# Patient Record
Sex: Female | Born: 1970 | Race: White | Hispanic: No | Marital: Single | Smoking: Former smoker
Health system: Southern US, Community
[De-identification: ages and names within clinical notes are randomized; demographics above are authoritative.]

## PROBLEM LIST (undated history)

## (undated) DIAGNOSIS — F32A Depression, unspecified: Secondary | ICD-10-CM

## (undated) DIAGNOSIS — I82409 Acute embolism and thrombosis of unspecified deep veins of unspecified lower extremity: Secondary | ICD-10-CM

## (undated) DIAGNOSIS — I2699 Other pulmonary embolism without acute cor pulmonale: Secondary | ICD-10-CM

## (undated) DIAGNOSIS — F329 Major depressive disorder, single episode, unspecified: Secondary | ICD-10-CM

## (undated) DIAGNOSIS — E079 Disorder of thyroid, unspecified: Secondary | ICD-10-CM

## (undated) DIAGNOSIS — Z87891 Personal history of nicotine dependence: Secondary | ICD-10-CM

## (undated) HISTORY — DX: Other pulmonary embolism without acute cor pulmonale: I26.99

## (undated) HISTORY — DX: Major depressive disorder, single episode, unspecified: F32.9

## (undated) HISTORY — DX: Disorder of thyroid, unspecified: E07.9

## (undated) HISTORY — DX: Acute embolism and thrombosis of unspecified deep veins of unspecified lower extremity: I82.409

## (undated) HISTORY — DX: Depression, unspecified: F32.A

## (undated) HISTORY — DX: Personal history of nicotine dependence: Z87.891

---

## 1999-05-28 ENCOUNTER — Other Ambulatory Visit: Admission: RE | Admit: 1999-05-28 | Discharge: 1999-05-28 | Payer: Self-pay | Admitting: Obstetrics and Gynecology

## 1999-05-31 ENCOUNTER — Ambulatory Visit (HOSPITAL_COMMUNITY): Admission: RE | Admit: 1999-05-31 | Discharge: 1999-05-31 | Payer: Self-pay | Admitting: Obstetrics and Gynecology

## 1999-06-18 ENCOUNTER — Ambulatory Visit (HOSPITAL_COMMUNITY): Admission: RE | Admit: 1999-06-18 | Discharge: 1999-06-18 | Payer: Self-pay | Admitting: Obstetrics and Gynecology

## 1999-07-09 ENCOUNTER — Ambulatory Visit (HOSPITAL_COMMUNITY): Admission: RE | Admit: 1999-07-09 | Discharge: 1999-07-09 | Payer: Self-pay | Admitting: Obstetrics and Gynecology

## 1999-07-23 ENCOUNTER — Ambulatory Visit (HOSPITAL_COMMUNITY): Admission: RE | Admit: 1999-07-23 | Discharge: 1999-07-23 | Payer: Self-pay | Admitting: Obstetrics and Gynecology

## 1999-08-13 ENCOUNTER — Encounter (HOSPITAL_COMMUNITY): Admission: RE | Admit: 1999-08-13 | Discharge: 1999-09-03 | Payer: Self-pay | Admitting: Obstetrics and Gynecology

## 1999-09-02 ENCOUNTER — Inpatient Hospital Stay (HOSPITAL_COMMUNITY): Admission: AD | Admit: 1999-09-02 | Discharge: 1999-09-04 | Payer: Self-pay | Admitting: Obstetrics and Gynecology

## 2001-08-22 ENCOUNTER — Other Ambulatory Visit: Admission: RE | Admit: 2001-08-22 | Discharge: 2001-08-22 | Payer: Self-pay | Admitting: Obstetrics and Gynecology

## 2003-06-23 ENCOUNTER — Emergency Department (HOSPITAL_COMMUNITY): Admission: AD | Admit: 2003-06-23 | Discharge: 2003-06-23 | Payer: Self-pay | Admitting: Family Medicine

## 2003-06-25 ENCOUNTER — Emergency Department (HOSPITAL_COMMUNITY): Admission: EM | Admit: 2003-06-25 | Discharge: 2003-06-25 | Payer: Self-pay | Admitting: Family Medicine

## 2003-09-08 ENCOUNTER — Encounter: Admission: RE | Admit: 2003-09-08 | Discharge: 2003-09-08 | Payer: Self-pay | Admitting: Family Medicine

## 2003-09-17 ENCOUNTER — Encounter: Admission: RE | Admit: 2003-09-17 | Discharge: 2003-09-17 | Payer: Self-pay | Admitting: Family Medicine

## 2003-09-22 ENCOUNTER — Encounter: Admission: RE | Admit: 2003-09-22 | Discharge: 2003-09-22 | Payer: Self-pay | Admitting: Family Medicine

## 2004-01-09 ENCOUNTER — Encounter: Admission: RE | Admit: 2004-01-09 | Discharge: 2004-01-09 | Payer: Self-pay | Admitting: Family Medicine

## 2004-01-29 ENCOUNTER — Ambulatory Visit: Payer: Self-pay | Admitting: Family Medicine

## 2004-08-16 ENCOUNTER — Ambulatory Visit: Payer: Self-pay | Admitting: Family Medicine

## 2004-11-05 ENCOUNTER — Ambulatory Visit: Payer: Self-pay | Admitting: Family Medicine

## 2005-01-14 ENCOUNTER — Ambulatory Visit: Payer: Self-pay | Admitting: Family Medicine

## 2005-07-27 ENCOUNTER — Encounter (INDEPENDENT_AMBULATORY_CARE_PROVIDER_SITE_OTHER): Payer: Self-pay | Admitting: *Deleted

## 2005-07-27 LAB — CONVERTED CEMR LAB

## 2005-08-12 ENCOUNTER — Ambulatory Visit: Payer: Self-pay | Admitting: Family Medicine

## 2005-08-29 ENCOUNTER — Ambulatory Visit: Payer: Self-pay | Admitting: Internal Medicine

## 2006-07-20 DIAGNOSIS — F329 Major depressive disorder, single episode, unspecified: Secondary | ICD-10-CM

## 2006-07-20 DIAGNOSIS — F17200 Nicotine dependence, unspecified, uncomplicated: Secondary | ICD-10-CM | POA: Insufficient documentation

## 2006-07-20 DIAGNOSIS — F3289 Other specified depressive episodes: Secondary | ICD-10-CM | POA: Insufficient documentation

## 2006-07-20 DIAGNOSIS — E669 Obesity, unspecified: Secondary | ICD-10-CM

## 2006-07-20 DIAGNOSIS — E039 Hypothyroidism, unspecified: Secondary | ICD-10-CM

## 2006-07-21 ENCOUNTER — Encounter (INDEPENDENT_AMBULATORY_CARE_PROVIDER_SITE_OTHER): Payer: Self-pay | Admitting: *Deleted

## 2006-11-27 ENCOUNTER — Telehealth: Payer: Self-pay | Admitting: *Deleted

## 2007-05-04 ENCOUNTER — Encounter (INDEPENDENT_AMBULATORY_CARE_PROVIDER_SITE_OTHER): Payer: Self-pay | Admitting: Family Medicine

## 2007-05-04 ENCOUNTER — Ambulatory Visit: Payer: Self-pay | Admitting: Family Medicine

## 2007-05-07 LAB — CONVERTED CEMR LAB
BUN: 9 mg/dL (ref 6–23)
CO2: 22 meq/L (ref 19–32)
Chloride: 106 meq/L (ref 96–112)
Glucose, Bld: 59 mg/dL — ABNORMAL LOW (ref 70–99)
Hemoglobin: 13.3 g/dL (ref 12.0–15.0)
MCHC: 31.7 g/dL (ref 30.0–36.0)
TSH: 6.996 microintl units/mL — ABNORMAL HIGH (ref 0.350–5.50)

## 2007-05-09 ENCOUNTER — Telehealth (INDEPENDENT_AMBULATORY_CARE_PROVIDER_SITE_OTHER): Payer: Self-pay | Admitting: Family Medicine

## 2007-06-18 ENCOUNTER — Encounter: Payer: Self-pay | Admitting: *Deleted

## 2007-08-10 ENCOUNTER — Encounter (INDEPENDENT_AMBULATORY_CARE_PROVIDER_SITE_OTHER): Payer: Self-pay | Admitting: Family Medicine

## 2007-08-10 ENCOUNTER — Ambulatory Visit: Payer: Self-pay | Admitting: Family Medicine

## 2007-08-15 ENCOUNTER — Telehealth (INDEPENDENT_AMBULATORY_CARE_PROVIDER_SITE_OTHER): Payer: Self-pay | Admitting: Family Medicine

## 2007-10-18 ENCOUNTER — Telehealth: Payer: Self-pay | Admitting: *Deleted

## 2007-11-21 ENCOUNTER — Encounter (INDEPENDENT_AMBULATORY_CARE_PROVIDER_SITE_OTHER): Payer: Self-pay | Admitting: Family Medicine

## 2007-11-21 ENCOUNTER — Ambulatory Visit: Payer: Self-pay | Admitting: Family Medicine

## 2007-11-29 ENCOUNTER — Emergency Department (HOSPITAL_COMMUNITY): Admission: EM | Admit: 2007-11-29 | Discharge: 2007-11-29 | Payer: Self-pay | Admitting: Family Medicine

## 2007-11-29 LAB — CONVERTED CEMR LAB
Cholesterol: 208 mg/dL — ABNORMAL HIGH (ref 0–200)
LDL Cholesterol: 110 mg/dL — ABNORMAL HIGH (ref 0–99)
Total CHOL/HDL Ratio: 4.6
Triglycerides: 263 mg/dL — ABNORMAL HIGH (ref <150)

## 2007-12-07 ENCOUNTER — Encounter (INDEPENDENT_AMBULATORY_CARE_PROVIDER_SITE_OTHER): Payer: Self-pay | Admitting: Family Medicine

## 2007-12-07 ENCOUNTER — Encounter (INDEPENDENT_AMBULATORY_CARE_PROVIDER_SITE_OTHER): Payer: Self-pay | Admitting: *Deleted

## 2008-01-02 ENCOUNTER — Ambulatory Visit: Payer: Self-pay | Admitting: Family Medicine

## 2008-01-02 ENCOUNTER — Encounter (INDEPENDENT_AMBULATORY_CARE_PROVIDER_SITE_OTHER): Payer: Self-pay | Admitting: Family Medicine

## 2008-01-04 ENCOUNTER — Telehealth: Payer: Self-pay | Admitting: *Deleted

## 2008-01-04 LAB — CONVERTED CEMR LAB: TSH: 6.109 microintl units/mL — ABNORMAL HIGH (ref 0.350–4.50)

## 2008-02-04 ENCOUNTER — Ambulatory Visit: Payer: Self-pay | Admitting: Family Medicine

## 2008-04-29 ENCOUNTER — Ambulatory Visit: Payer: Self-pay | Admitting: Family Medicine

## 2008-04-29 ENCOUNTER — Encounter (INDEPENDENT_AMBULATORY_CARE_PROVIDER_SITE_OTHER): Payer: Self-pay | Admitting: Family Medicine

## 2008-04-29 LAB — CONVERTED CEMR LAB
Blood in Urine, dipstick: NEGATIVE
Ketones, urine, test strip: NEGATIVE
WBC Urine, dipstick: NEGATIVE
pH: 7

## 2008-05-12 ENCOUNTER — Telehealth: Payer: Self-pay | Admitting: *Deleted

## 2008-06-24 ENCOUNTER — Encounter (INDEPENDENT_AMBULATORY_CARE_PROVIDER_SITE_OTHER): Payer: Self-pay | Admitting: Family Medicine

## 2008-06-24 ENCOUNTER — Ambulatory Visit: Payer: Self-pay | Admitting: Family Medicine

## 2008-06-25 LAB — CONVERTED CEMR LAB
Free T4: 1.12 ng/dL (ref 0.89–1.80)
T3, Free: 2.7 pg/mL (ref 2.3–4.2)

## 2008-09-29 ENCOUNTER — Ambulatory Visit: Payer: Self-pay | Admitting: Family Medicine

## 2008-09-29 ENCOUNTER — Encounter (INDEPENDENT_AMBULATORY_CARE_PROVIDER_SITE_OTHER): Payer: Self-pay | Admitting: Family Medicine

## 2008-09-30 ENCOUNTER — Encounter (INDEPENDENT_AMBULATORY_CARE_PROVIDER_SITE_OTHER): Payer: Self-pay | Admitting: Family Medicine

## 2008-09-30 LAB — CONVERTED CEMR LAB: TSH: 5.01 microintl units/mL — ABNORMAL HIGH (ref 0.350–4.500)

## 2008-10-02 LAB — CONVERTED CEMR LAB: T3, Free: 2.5 pg/mL (ref 2.3–4.2)

## 2008-11-05 ENCOUNTER — Ambulatory Visit: Payer: Self-pay | Admitting: Family Medicine

## 2008-12-17 ENCOUNTER — Telehealth: Payer: Self-pay | Admitting: *Deleted

## 2008-12-24 ENCOUNTER — Telehealth: Payer: Self-pay | Admitting: Family Medicine

## 2009-01-09 ENCOUNTER — Encounter: Payer: Self-pay | Admitting: Family Medicine

## 2009-01-09 ENCOUNTER — Ambulatory Visit: Payer: Self-pay | Admitting: Family Medicine

## 2009-01-20 ENCOUNTER — Ambulatory Visit: Payer: Self-pay | Admitting: Family Medicine

## 2009-03-11 ENCOUNTER — Ambulatory Visit: Payer: Self-pay | Admitting: Family Medicine

## 2009-03-11 ENCOUNTER — Encounter: Payer: Self-pay | Admitting: Family Medicine

## 2009-03-12 ENCOUNTER — Encounter: Payer: Self-pay | Admitting: Family Medicine

## 2009-07-22 ENCOUNTER — Telehealth (INDEPENDENT_AMBULATORY_CARE_PROVIDER_SITE_OTHER): Payer: Self-pay | Admitting: Family Medicine

## 2009-10-11 ENCOUNTER — Emergency Department (HOSPITAL_COMMUNITY): Admission: EM | Admit: 2009-10-11 | Discharge: 2009-10-11 | Payer: Self-pay | Admitting: Family Medicine

## 2010-03-03 ENCOUNTER — Ambulatory Visit: Payer: Self-pay | Admitting: Family Medicine

## 2010-04-09 ENCOUNTER — Other Ambulatory Visit: Admission: RE | Admit: 2010-04-09 | Discharge: 2010-04-09 | Payer: Self-pay | Admitting: Family Medicine

## 2010-04-09 ENCOUNTER — Ambulatory Visit: Payer: Self-pay | Admitting: Family Medicine

## 2010-04-12 ENCOUNTER — Ambulatory Visit: Payer: Self-pay | Admitting: Family Medicine

## 2010-04-12 ENCOUNTER — Encounter: Payer: Self-pay | Admitting: Family Medicine

## 2010-04-12 LAB — CONVERTED CEMR LAB
BUN: 7 mg/dL (ref 6–23)
CO2: 27 meq/L (ref 19–32)
Calcium: 9.3 mg/dL (ref 8.4–10.5)
Creatinine, Ser: 0.74 mg/dL (ref 0.40–1.20)
HDL: 57 mg/dL (ref >39)
LDL Cholesterol: 105 mg/dL — ABNORMAL HIGH (ref 0–99)
Total CHOL/HDL Ratio: 3.4
Triglycerides: 166 mg/dL — ABNORMAL HIGH (ref <150)
VLDL: 33 mg/dL (ref 0–40)

## 2010-04-14 ENCOUNTER — Encounter: Payer: Self-pay | Admitting: Family Medicine

## 2010-05-05 ENCOUNTER — Telehealth: Payer: Self-pay | Admitting: Family Medicine

## 2010-06-24 NOTE — Assessment & Plan Note (Signed)
Summary: cpe,df   Vital Signs:  Patient profile:   40 year old female Height:      69 inches Weight:      222 pounds BMI:     32.90 Temp:     97.9 degrees F oral Pulse rate:   74 / minute BP sitting:   120 / 78  (left arm) Cuff size:   large  Vitals Entered By: Tessie Fass CMA (April 09, 2010 8:59 AM) CC: complete physical with pap Is Patient Diabetic? No Pain Assessment Patient in pain? no        Primary Care Provider:  Delbert Harness MD  CC:  complete physical with pap.  History of Present Illness: 40 yo here for annual gyn  LMP:  end of october Contraception: abstinence Regular Menses: yes Hx of Anemia: no FHx of Breast, Uterine, Cervical or Ovarian Cancer:  No Last Pap: 2009, last few paps normal, colposcopy in the past Hx of Abnormal Pap:  No Desires STD testing: No Last Mammogram: none Abnormalities on Self-exam:  No  hypothyroidism:  continues to take synthroid.  no constipation, palpiatios, weight changed  Depression:  feels wel  tobacco use:  smoking about a pack per day but is working on [employer] nicotine patches.  is usuing 21 mg patch feels like her blood pressure is higher with this but does not have any readings.  Obesity: has lost about 10 pounds since last visit.  no regular exercise or diet      Habits & Providers  Alcohol-Tobacco-Diet     Tobacco Status: quit  Current Medications (verified): 1)  Synthroid 150 Mcg Tabs (Levothyroxine Sodium) .... Take One Tablet Daily As Directed 2)  Triamcinolone Acetonide 0.5 % Oint (Triamcinolone Acetonide) .... Apply To Left Shin Two Times A Day - Cover The Shin At Night To Prevent Scratching - Disp 60 Grams  Allergies: No Known Drug Allergies PMH-FH-SH reviewed-no changes except otherwise noted, PMH-FH-SH reviewed for relevance  Social History: Single.  Living in residence owned by her parents.  Previously worked at [employer] - lost job in 4/09 and has been unemployed since.  Has 2  children. Currently in custody battle with father of second child, tobacco abuse - had quit 10/08 (nicotine patch), but now smoking 7cig/day (mult prior attempts)Smoking Status:  quit  Review of Systems      See HPI  Physical Exam  General:  Overweight-appearing, no acute distress.  Alert and oriented x 3.  Vitals reviewed. Neck:  Supple, non-tender,no thyromegaly , no anterior or posterior cervical, submandibular, occipital, or supraclavicular lymphadenopathy.   Breasts:  No mass, nodules, thickening, tenderness, bulging, retraction, inflamation, nipple discharge or skin changes noted.   Lungs:  Normal respiratory effort, chest expands symmetrically. Lungs are clear to auscultation, no crackles or wheezes. Heart:  Normal rate and regular rhythm. S1 and S2 normal without gallop, murmur, click, rub or other extra sounds. Abdomen:  Bowel sounds positive,abdomen soft and non-tender without masses, organomegaly or hernias noted. Genitalia:  Pelvic Exam:        External: normal female genitalia without lesions or masses        Vagina: normal without lesions or masses        Cervix: normal without lesions or masses        Adnexa: normal bimanual exam without masses or fullness        Uterus: normal by palpation        Pap smear: performed Extremities:  no LE edema  Impression & Recommendations:  Problem # 1:  ROUTINE GYNECOLOGICAL EXAMINATION (ICD-V72.31)  Pap smear  today.  Abstinent, without need or contraception.  Normal exam.  Orders: [facility] - Est  18-39 yrs ([address])  Problem # 2:  TOBACCO USER (ICD-305.1)  Discussed resources avaliable at [facility]- gave her [place] quit line info.  She feels she is doing well  at this time with nicotine pacthes, will call for further help if needed.  Orders: [facility] - Est  18-39 yrs ([id number])  Problem # 3:  HYPOTHYROIDISM, UNSPECIFIED (ICD-244.9)  Will check TSh today.  Her updated medication list for this problem includes:    Synthroid 150 Mcg Tabs  (Levothyroxine sodium) .Marland Kitchen... Take one tablet daily as directed  Orders: [facility] - Est  18-39 yrs ([id number])  Complete Medication List: 1)  Synthroid 150 Mcg Tabs (Levothyroxine sodium) .... Take one tablet daily as directed 2)  Triamcinolone Acetonide 0.5 % Oint (Triamcinolone acetonide) .... Apply to left shin two times a day - cover the shin at night to prevent scratching - disp 60 grams  Other Orders: Pap Smear-[facility] ([id number])  Patient Instructions: 1)  Make appointment for fasting labwork 2)  I wil lhceck your fasting glucose, cholesterol, and TSH 3)  See info on Quit line- if you need additional help, we have lots of providers experienced in tobacco cessation! 4)  follow-up in 1 year or sooner if needed   Orders Added: 1)  Pap Smear-[facility] [[id number]] 2)  [facility] - Est  18-39 yrs [[id number]]     Prevention & Chronic Care Immunizations   Influenza vaccine: Fluvax 3+  (03/03/2010)    Tetanus booster: 08/22/2003: Done.   Tetanus booster due: 08/21/2013    Pneumococcal vaccine: Not documented  Other Screening   Pap smear: normal  (08/10/2007)   Pap smear due: 08/09/2009   Smoking status: quit  (04/09/2010)  Lipids   Total Cholesterol: 208  (11/21/2007)   LDL: 110  (11/21/2007)   LDL Direct: Not documented   HDL: 45  (11/21/2007)   Triglycerides: 263  (11/21/2007)

## 2010-06-24 NOTE — Progress Notes (Signed)
Summary: Refill  Phone Note Refill Request Call back at [phone]   Refills Requested: Medication #1:  SYNTHROID 150 MCG TABS Take one tablet daily as directed pt does not have insurance, says if MD could refill for one month she is trying to apply for insurance. pt goes to [facility].  Initial call taken by: Knox Royalty,  July 22, 2009 11:04 AM  Follow-up for Phone Call        I refilled it yesterday.  Let me know if there is a problem with this refill. Follow-up by: Delbert Harness MD,  July 22, 2009 1:27 PM  Additional Follow-up for Phone Call Additional follow up Details #1::        left message for pt to return call Additional Follow-up by: Gladstone Pih,  July 22, 2009 2:36 PM    Additional Follow-up for Phone Call Additional follow up Details #2::    notified pt, she will check with pharm Follow-up by: Gladstone Pih,  July 22, 2009 4:16 PM

## 2010-06-24 NOTE — Progress Notes (Signed)
Summary: Rx  Phone Note Refill Request Call back at Home Phone [phone]   Refills Requested: Medication #1:  SYNTHROID 150 MCG TABS Take one tablet daily as directed Initial call taken by: Knox Royalty,  May 05, 2010 9:21 AM    Prescriptions: SYNTHROID 150 MCG TABS (LEVOTHYROXINE SODIUM) Take one tablet daily as directed  #31 x 11   Entered and Authorized by:   Delbert Harness MD   Signed by:   Delbert Harness MD on 05/05/2010   Method used:   Electronically to        [facility]  [phone]* (retail)       [address]       [place], [place]  [address]       Ph: [phone] or [phone]       Fax: [phone]   RxID:   [id number]   Appended Document: Rx Pt notified rx was sent.

## 2010-06-24 NOTE — Assessment & Plan Note (Signed)
Summary: flu shot,df  Nurse Visit   Vital Signs:  Patient profile:   40 year old female Temp:     98.2 degrees F  Vitals Entered By: Theresia Lo RN (March 03, 2010 4:34 PM)  Allergies: No Known Drug Allergies  Immunizations Administered:  Influenza Vaccine # 1:    Vaccine Type: Fluvax 3+    Site: right deltoid    Mfr: GlaxoSmithKline    Dose: 0.5 ml    Given by: Theresia Lo RN    Exp. Date: 11/17/2010    Lot #: [id number]    VIS given: 12/15/09 version given March 03, 2010.  Flu Vaccine Consent Questions:    Do you have a history of severe allergic reactions to this vaccine? no    Any prior history of allergic reactions to egg and/or gelatin? no    Do you have a sensitivity to the preservative Thimersol? no    Do you have a past history of Guillan-Barre Syndrome? no    Do you currently have an acute febrile illness? no    Have you ever had a severe reaction to latex? no    Vaccine information given and explained to patient? yes    Are you currently pregnant? no  Orders Added: 1)  Flu Vaccine 81yrs + [[id number]] 2)  Admin 1st Vaccine [[id number]]

## 2010-06-24 NOTE — Letter (Signed)
Summary: Lipid Letter  [facility]  [address]   [place], [place] [address]   Phone: [phone]  Fax: [phone]    04/14/2010  Diane [name] [address] [place], [place]  [address]  Dear Diane [name]:  We have carefully reviewed your last lipid profile from 04/12/2010 and the results are noted below with a summary of recommendations for lipid management.    Cholesterol:       195     Goal: <200   HDL "good" Cholesterol:   57     Goal: >40   LDL "bad" Cholesterol:   105     Goal: <130   Triglycerides:       166     Goal: <150    Your cholesterol labwok was ok.  I recommend continuing to work on [employer], exercise, and weight loss to improve your health.  your TSH (thyroid) and your fasting glucose (diabetes) was also in the normal range.    TLC Diet (Therapeutic Lifestyle Change): Saturated Fats & Transfatty acids should be kept < 7% of total calories ***Reduce Saturated Fats Polyunstaurated Fat can be up to 10% of total calories Monounsaturated Fat Fat can be up to 20% of total calories Total Fat should be no greater than 25-35% of total calories Carbohydrates should be 50-60% of total calories Protein should be approximately 15% of total calories Fiber should be at least 20-30 grams a day ***Increased fiber may help lower LDL Total Cholesterol should be < 200mg /day Consider adding plant stanol/sterols to diet (example: Benacol spread) ***A higher intake of unsaturated fat may reduce Triglycerides and Increase HDL    Adjunctive Measures (may lower LIPIDS and reduce risk of Heart Attack) include: Aerobic Exercise (20-30 minutes 3-4 times a week) Limit Alcohol Consumption Weight Reduction Dietary Fiber 20-30 grams a day by mouth  If you have any questions, please call. We appreciate being able to work with you.   Sincerely,    [facility] Delbert Harness MD  Appended Document: Lipid Letter mailed

## 2010-07-09 ENCOUNTER — Encounter: Payer: Self-pay | Admitting: *Deleted

## 2010-10-08 NOTE — Discharge Summary (Signed)
[facility]  Patient:    Diane [name], Diane [name]                     MRN: [id number] Adm. Date:  [id number] Disc. Date: [id number] Attending:  Michaele Offer                           Discharge Summary  ADMISSION DIAGNOSES:          1. Intrauterine pregnancy at 41 weeks, with a                                  favorable cervix.                               2. Pregnancy-induced hypertension.                               3. Baby up for adoption.                               4. Group B strep carrier.  DISCHARGE DIAGNOSES:          1. Intrauterine pregnancy at 41 weeks, with a                                  favorable cervix.                               2. Pregnancy-induced hypertension.                               3. Baby up for adoption.                               4. Group B strep carrier.  PROCEDURES:                   Spontaneous vaginal delivery.  COMPLICATIONS:                None.  CONSULTATIONS:                Social work.  HISTORY OF PRESENT ILLNESS:   This is a 40 year old white female, gravida 2, para 1-0-0-1, with an EGA of 41+ weeks by an LMP, consistent with a second-trimester  ultrasound with an EDC of August 23, 1999, who presents for induction due to going past her due date and a favorable cervix.  Pregnancy complicated by the fact that she smoked during the pregnancy and had some borderline elevated blood pressures at the last several office visits without signs or symptoms of preeclampsia.  She ad late prenatal care at approximately 27 weeks, and is giving the baby up for adoption.  PRENATAL LABORATORIES:        Blood type is A negative, with a negative antibody screen.  RPR is nonreactive.  Rubella nonimmune.  Hepatitis B surface antigen negative.  HIV negative.  Gonorrhea and chlamydia negative.  Group B strep positive.  PAST  OBSTETRICAL HISTORY:     In 1994, vaginal delivery, 8 pounds, 14 ounces, complicated  by elevated blood pressure.  PAST GYNECOLOGIC HISTORY:     Chlamydia treated in 1993.  PAST MEDICAL HISTORY:         Possible history of borderline hypertension.  Remainder of history noncontributory.  PHYSICAL EXAMINATION:         Blood pressure 120/73.  Non-stress test was reactive.  She had occasional contractions.  Abdomen was gravid and nontender.  Cervix was 2+, 50, and -3.  HOSPITAL COURSE:              The patient was admitted and started on Pitocin augmentation due to the high station, and penicillin for group B strep prophylaxis. With the aid of Pitocin, she gradually entered active labor, initially received IV Stadol, and then an epidural.  On the evening of September 02, 1999, she progressed to complete and pushed well and had an SVD of a vigorous female infant with Apgars of 9 and 9, weight 7 pounds 6 ounces, over a second-degree perineal laceration. Placenta delivered spontaneously and had a three-vessel cord.  Her second-degree laceration was repaired with 2-0 Vicryl, and her cervix and rectum were intact. At this point, the patient was now undecided as to whether she was going to keep the baby or put it up for adoption.  Postpartum she did very well, remained afebrile, and had no other significant problems.  On the morning of postpartum day #2, she was stable for discharge home.  At this time, she was still unsure if she was going to give the baby up for adoption or not.  She appeared to want the baby to go home, but lives with her parents, and her parents were not very accepting of this idea. This decision is unresolved at the time of this dictation.  CONDITION ON DISCHARGE:       Stable.  DISPOSITION:                  Discharged to home.  DIET:                         Regular.  ACTIVITY:                     Pelvic rest.  FOLLOW-UP:                    In six weeks.  DISCHARGE INSTRUCTIONS:       She was given our discharge summary  pamphlet.  MEDICATIONS:                  Percocet p.r.n. pain. DD:  09/04/99 TD:  09/05/99 Job: [id number]

## 2011-01-21 ENCOUNTER — Encounter: Payer: Self-pay | Admitting: Family Medicine

## 2011-01-21 ENCOUNTER — Ambulatory Visit (INDEPENDENT_AMBULATORY_CARE_PROVIDER_SITE_OTHER): Payer: Medicaid Other | Admitting: Family Medicine

## 2011-01-21 VITALS — BP 136/93 | HR 93 | Wt 232.8 lb

## 2011-01-21 DIAGNOSIS — K219 Gastro-esophageal reflux disease without esophagitis: Secondary | ICD-10-CM

## 2011-01-21 LAB — POCT H PYLORI SCREEN: H Pylori Screen, POC: NEGATIVE

## 2011-01-21 MED ORDER — OMEPRAZOLE 20 MG PO CPDR: 20.0000 mg | DELAYED_RELEASE_CAPSULE | Freq: Every day | ORAL | Status: DC

## 2011-01-21 NOTE — Progress Notes (Signed)
  Subjective:    Patient ID: Diane [name], female    DOB: 12/25/1970, 40 y.o.   MRN: [id number]  HPI   approx 1 week of epigastric burning.  Notes after some meals, lasts for 10-15 minutes.  Rolaids helped some.  No food triggers identified.  Feels "sour" and points to epigastric region.  No worse with laying flat.  Has not tried a PPI before. + Smoker  Review of Systems   No fever, nausea, vomiting, watery diarrhea, or abdominal pain. Objective:   Physical Exam  GEN: Alert & Oriented, No acute distress CV:  Regular Rate & Rhythm, no murmur Respiratory:  Normal work of breathing, CTAB Abd:  + BS, soft, mild tenderness to palpation in epigastric region Ext: no pre-tibial edema       Assessment & Plan:

## 2011-01-21 NOTE — Assessment & Plan Note (Addendum)
Classic GERD.  H Pylori negative.  Will treat for two weeks with omeprazole and then prn.  Stressed imp[rotance of lifestyle factors such as smoking, obesity, and food choices.  Advised to follow-up if no improvement

## 2011-01-21 NOTE — Patient Instructions (Signed)
Take medicine every day for 2 weeks, then can taper off if you feel better Will check for H. Pylori bacteria  See info below on things you can do to help GERD without medicines

## 2011-02-17 LAB — URINE CULTURE: Colony Count: 45000

## 2011-02-17 LAB — POCT URINALYSIS DIP (DEVICE)
Glucose, UA: NEGATIVE
Protein, ur: NEGATIVE
Specific Gravity, Urine: 1.025
pH: 5.5

## 2011-03-03 ENCOUNTER — Ambulatory Visit (INDEPENDENT_AMBULATORY_CARE_PROVIDER_SITE_OTHER): Payer: Medicaid Other

## 2011-03-03 DIAGNOSIS — Z23 Encounter for immunization: Secondary | ICD-10-CM

## 2011-05-19 ENCOUNTER — Telehealth: Payer: Self-pay | Admitting: Family Medicine

## 2011-05-19 MED ORDER — LEVOTHYROXINE SODIUM 150 MCG PO TABS: 150.0000 ug | ORAL_TABLET | Freq: Every day | ORAL | Status: DC

## 2011-05-19 NOTE — Telephone Encounter (Signed)
Ms. Diane [name] has an appt next week but she has 1 generic of Synthroid left and is hoping for enough to last till her appt, sent to [facility] on [address].  Please call her with the decision.

## 2011-05-19 NOTE — Telephone Encounter (Signed)
Please call patient and let her know I have refilled synthroid x 1 month

## 2011-05-25 ENCOUNTER — Encounter: Payer: Self-pay | Admitting: Family Medicine

## 2011-05-25 ENCOUNTER — Ambulatory Visit (INDEPENDENT_AMBULATORY_CARE_PROVIDER_SITE_OTHER): Payer: Medicaid Other | Admitting: Family Medicine

## 2011-05-25 DIAGNOSIS — M545 Low back pain: Secondary | ICD-10-CM

## 2011-05-25 DIAGNOSIS — E039 Hypothyroidism, unspecified: Secondary | ICD-10-CM

## 2011-05-25 DIAGNOSIS — E669 Obesity, unspecified: Secondary | ICD-10-CM

## 2011-05-25 DIAGNOSIS — K219 Gastro-esophageal reflux disease without esophagitis: Secondary | ICD-10-CM

## 2011-05-25 DIAGNOSIS — G4700 Insomnia, unspecified: Secondary | ICD-10-CM | POA: Insufficient documentation

## 2011-05-25 DIAGNOSIS — F329 Major depressive disorder, single episode, unspecified: Secondary | ICD-10-CM

## 2011-05-25 NOTE — Patient Instructions (Signed)
Great job on exercising and weight loss!  Make appt for fasting lab draw  See info on sleep hygiene.  We know sleep, appetite and mood are closely related  Follow-up in 1 year

## 2011-05-25 NOTE — Assessment & Plan Note (Signed)
Gave info on sleep hygiene, advised keeping sleep diary and return visit if no improvement.

## 2011-05-25 NOTE — Progress Notes (Signed)
  Subjective:    Patient ID: Diane [name], female    DOB: 1971-01-10, 41 y.o.   MRN: [id number]  HPI Here for follow-up hypothyroidism Taking synthroid regulalry Denies chest pain, dyspnea, fatigue, constipation, palpitations.  Back pain:  Low back, sacrum, moderate pain, lasts several days then self resolves.  Occurs every few weeks.  No known triggers such as activity.  Worse with sitting, standing, bending.  Obesity: has been walking on treadmill more.  7 pounds of weight loss since august  Gerd: improved now taht she has quit smoking, not taking any meds.   Review of Systemssee above.  + insomnia, negative for new stress, anhedonia     Objective:   Physical Exam GEN: Alert & Oriented, No acute distress Neck: supple CV:  Regular Rate & Rhythm, no murmur Respiratory:  Normal work of breathing, CTAB Abd:  + BS, soft, no tenderness to palpation Ext: no pre-tibial edema Back: spine and paraspinal muscles nontender to palpation       Assessment & Plan:

## 2011-05-25 NOTE — Assessment & Plan Note (Signed)
Resolved with cessation of smoking

## 2011-05-25 NOTE — Assessment & Plan Note (Signed)
Doing well without meds

## 2011-05-25 NOTE — Assessment & Plan Note (Signed)
Will return for fasting bloodwork.  Has history of mildly elevated triglycerides

## 2011-05-25 NOTE — Assessment & Plan Note (Signed)
Will check TSH annually, has been on stable dose

## 2011-05-25 NOTE — Assessment & Plan Note (Signed)
Intermittent, no red flags, not present currently. Likely MSK back pain.  Advised weight loss, increasing core strength, and if continues to follow-up when painful for exam and possible referral to PT.

## 2011-05-27 ENCOUNTER — Other Ambulatory Visit: Payer: Medicaid Other

## 2011-05-27 DIAGNOSIS — E039 Hypothyroidism, unspecified: Secondary | ICD-10-CM

## 2011-05-27 DIAGNOSIS — E669 Obesity, unspecified: Secondary | ICD-10-CM

## 2011-05-27 LAB — COMPREHENSIVE METABOLIC PANEL
ALT: 9 U/L (ref 0–35)
AST: 11 U/L (ref 0–37)
Alkaline Phosphatase: 51 U/L (ref 39–117)
BUN: 7 mg/dL (ref 6–23)
Glucose, Bld: 92 mg/dL (ref 70–99)
Total Bilirubin: 0.5 mg/dL (ref 0.3–1.2)

## 2011-05-27 LAB — LIPID PANEL: Triglycerides: 104 mg/dL (ref <150)

## 2011-05-27 NOTE — Progress Notes (Signed)
TSH,FLP AND CMP DONE TODAY Diane [name]

## 2011-05-30 ENCOUNTER — Encounter: Payer: Self-pay | Admitting: Family Medicine

## 2011-06-21 ENCOUNTER — Other Ambulatory Visit: Payer: Self-pay | Admitting: Family Medicine

## 2011-06-21 MED ORDER — LEVOTHYROXINE SODIUM 150 MCG PO TABS: 150.0000 ug | ORAL_TABLET | Freq: Every day | ORAL | Status: DC

## 2011-07-18 ENCOUNTER — Ambulatory Visit (INDEPENDENT_AMBULATORY_CARE_PROVIDER_SITE_OTHER): Payer: Medicaid Other | Admitting: *Deleted

## 2011-07-18 DIAGNOSIS — Z111 Encounter for screening for respiratory tuberculosis: Secondary | ICD-10-CM

## 2011-07-20 ENCOUNTER — Ambulatory Visit (INDEPENDENT_AMBULATORY_CARE_PROVIDER_SITE_OTHER): Payer: Medicaid Other | Admitting: *Deleted

## 2011-07-20 DIAGNOSIS — IMO0001 Reserved for inherently not codable concepts without codable children: Secondary | ICD-10-CM

## 2011-07-20 DIAGNOSIS — Z111 Encounter for screening for respiratory tuberculosis: Secondary | ICD-10-CM

## 2011-07-20 LAB — TB SKIN TEST
Induration: 0
TB Skin Test: NEGATIVE mm

## 2011-08-08 ENCOUNTER — Ambulatory Visit: Payer: Medicaid Other | Admitting: Family Medicine

## 2011-08-09 ENCOUNTER — Ambulatory Visit: Payer: Medicaid Other | Admitting: Family Medicine

## 2011-10-20 ENCOUNTER — Telehealth: Payer: Self-pay | Admitting: Family Medicine

## 2011-10-20 ENCOUNTER — Ambulatory Visit (INDEPENDENT_AMBULATORY_CARE_PROVIDER_SITE_OTHER): Payer: Medicaid Other | Admitting: Family Medicine

## 2011-10-20 ENCOUNTER — Encounter: Payer: Self-pay | Admitting: Family Medicine

## 2011-10-20 VITALS — BP 142/97 | HR 82 | Temp 98.4°F | Ht 69.0 in | Wt 218.0 lb

## 2011-10-20 DIAGNOSIS — K0889 Other specified disorders of teeth and supporting structures: Secondary | ICD-10-CM

## 2011-10-20 DIAGNOSIS — K089 Disorder of teeth and supporting structures, unspecified: Secondary | ICD-10-CM

## 2011-10-20 MED ORDER — CLINDAMYCIN HCL 300 MG PO CAPS: 300.0000 mg | ORAL_CAPSULE | Freq: Three times a day (TID) | ORAL | Status: AC

## 2011-10-20 NOTE — Telephone Encounter (Signed)
Patient thinks she might have an abscessed tooth and wants to be seen today.

## 2011-10-20 NOTE — Telephone Encounter (Signed)
Called patient and she has already been scheduled today.

## 2011-10-20 NOTE — Progress Notes (Signed)
  Subjective:    Patient ID: Diane [name], female    DOB: 1970-06-13, 41 y.o.   MRN: [id number]  HPI Dental pain: Patient reports left upper front dental pain off and on times weeks. Hasn't controlling pain with ibuprofen. Has a cracked tooth in the area of the pain. This morning woke up with swelling in her upper jaw in this area. No increase in pain. But is concerned about the swelling. No fever. Mild tenderness in the area. No pain or swelling into left cheekbone area. Normal eye movements. No vision changes. No problems with swallowing. No throat swelling. Patient has long history of dental problems. Has only a front teeth. All teeth missing in back of upper and lower jaws.  Smoking status reviewed.   Review of Systems As per above.    Objective:   Physical Exam  Constitutional: She appears well-developed and well-nourished. No distress.  HENT:  Head: Normocephalic and atraumatic.       Swelling of right upper jaw area- visible swelling externally.  + tenderness along small area of gumline of upper front teeth.  No fluctuance. No drainage.  No obvious erythema inside mouth.  Cardiovascular: Normal rate.   Pulmonary/Chest: Effort normal. No respiratory distress.  Neurological: She is alert.  Skin: Skin is warm. No rash noted.  Psychiatric: She has a normal mood and affect.          Assessment & Plan:

## 2011-10-20 NOTE — Patient Instructions (Signed)
Take antibiotic as directed- clindamycin 3 x per day.  Make an appointment asap with dentist.  Ibuprofen up to  800mg  every 8 hours as needed for pain

## 2011-10-20 NOTE — Telephone Encounter (Signed)
I was able to to put this patient in a Cancellation that came in after I got off of the phone with her, so I called her back.

## 2011-10-24 DIAGNOSIS — K0889 Other specified disorders of teeth and supporting structures: Secondary | ICD-10-CM | POA: Insufficient documentation

## 2011-10-24 NOTE — Assessment & Plan Note (Signed)
+   swelling of left upper jaw on external exam, and gumline tenderness. Pt to Take antibiotic as directed- clindamycin. Make an appointment asap with dentist. Ibuprofen up to  800mg  every 8 hours as needed for pain

## 2012-03-29 ENCOUNTER — Ambulatory Visit: Payer: Medicaid Other

## 2012-03-30 ENCOUNTER — Ambulatory Visit (INDEPENDENT_AMBULATORY_CARE_PROVIDER_SITE_OTHER): Payer: Medicaid Other | Admitting: *Deleted

## 2012-03-30 DIAGNOSIS — Z23 Encounter for immunization: Secondary | ICD-10-CM

## 2012-04-05 ENCOUNTER — Encounter: Payer: Self-pay | Admitting: Family Medicine

## 2012-04-05 ENCOUNTER — Ambulatory Visit (INDEPENDENT_AMBULATORY_CARE_PROVIDER_SITE_OTHER): Payer: Medicaid Other | Admitting: Family Medicine

## 2012-04-05 VITALS — BP 125/81 | HR 90 | Temp 98.5°F | Ht 68.0 in | Wt 210.0 lb

## 2012-04-05 DIAGNOSIS — F329 Major depressive disorder, single episode, unspecified: Secondary | ICD-10-CM

## 2012-04-05 DIAGNOSIS — E669 Obesity, unspecified: Secondary | ICD-10-CM

## 2012-04-05 DIAGNOSIS — E039 Hypothyroidism, unspecified: Secondary | ICD-10-CM

## 2012-04-05 DIAGNOSIS — Z0000 Encounter for general adult medical examination without abnormal findings: Secondary | ICD-10-CM

## 2012-04-05 DIAGNOSIS — F17200 Nicotine dependence, unspecified, uncomplicated: Secondary | ICD-10-CM

## 2012-04-05 DIAGNOSIS — Z720 Tobacco use: Secondary | ICD-10-CM

## 2012-04-05 NOTE — Patient Instructions (Addendum)
Make appointment for Fasting labwork after the new year  If you need extra help with quitting smoking, we have smoking counselors or you can call 1-800-quitnow for free confidential phone counseling  Keep up the good work- this past year was a good one for your weight and stopping smoking!!  See info for mammogram

## 2012-04-06 DIAGNOSIS — Z0000 Encounter for general adult medical examination without abnormal findings: Secondary | ICD-10-CM | POA: Insufficient documentation

## 2012-04-06 DIAGNOSIS — Z87891 Personal history of nicotine dependence: Secondary | ICD-10-CM

## 2012-04-06 HISTORY — DX: Personal history of nicotine dependence: Z87.891

## 2012-04-06 NOTE — Assessment & Plan Note (Signed)
Restarted smoking after quitting for a year.  She feel she can quit again.  Reminded her of availability of resources and further counseling is she should need it, encouraged her on her successes in the past.

## 2012-04-06 NOTE — Assessment & Plan Note (Signed)
Discussed screening recs, due for fasting labs in January.  Follow-up annually

## 2012-04-06 NOTE — Progress Notes (Signed)
  Subjective:    Patient ID: Diane [name], female    DOB: 1970-11-12, 41 y.o.   MRN: [id number]  HPI Annual Gynecological Exam   Wt Readings from Last 3 Encounters:  04/05/12 210 lb (95.255 kg)  10/20/11 218 lb (98.884 kg)  05/25/11 225 lb (102.059 kg)   Last period: 03/23/2012 Regular periods: yes   Heavy bleeding: no  Sexually active:no Birth control or hormonal therapy: no Hx of STD: Patient does not desire STD screening Vaginal discharge:no  Dysuria:No no  Last mammogram: never  Breast mass or concerns: No Last Pap: 2 years ago  History of abnormal pap: No  FH of breast, uterine, ovarian, colon cancer: No   Hypothyroidism:  No new symptoms of fatigue, constipation- has been on stable dose for many years  Review of Systemssee HPI     Objective:   Physical Exam GEN: Alert & Oriented, No acute distress CV:  Regular Rate & Rhythm, no murmur Respiratory:  Normal work of breathing, CTAB Abd:  + BS, soft, no tenderness to palpation Ext: no pre-tibial edema Declines breast and pelvic exam       Assessment & Plan:

## 2012-04-06 NOTE — Assessment & Plan Note (Signed)
Has made improvement due to lifestyle change, congratulated her on her success and encouraged continued progress

## 2012-04-06 NOTE — Assessment & Plan Note (Signed)
Will follow-up TSh annually in January

## 2012-04-06 NOTE — Assessment & Plan Note (Signed)
Feels she continues to do well withuot medications

## 2012-06-05 ENCOUNTER — Other Ambulatory Visit: Payer: Self-pay | Admitting: Family Medicine

## 2012-06-05 DIAGNOSIS — Z1231 Encounter for screening mammogram for malignant neoplasm of breast: Secondary | ICD-10-CM

## 2012-06-06 ENCOUNTER — Other Ambulatory Visit: Payer: Medicaid Other

## 2012-06-06 DIAGNOSIS — E669 Obesity, unspecified: Secondary | ICD-10-CM

## 2012-06-06 DIAGNOSIS — E039 Hypothyroidism, unspecified: Secondary | ICD-10-CM

## 2012-06-06 LAB — LIPID PANEL
Cholesterol: 215 mg/dL — ABNORMAL HIGH (ref 0–200)
LDL Cholesterol: 138 mg/dL — ABNORMAL HIGH (ref 0–99)
Total CHOL/HDL Ratio: 3.4 Ratio
Triglycerides: 70 mg/dL (ref <150)
VLDL: 14 mg/dL (ref 0–40)

## 2012-06-06 LAB — COMPREHENSIVE METABOLIC PANEL
AST: 11 U/L (ref 0–37)
Alkaline Phosphatase: 44 U/L (ref 39–117)
BUN: 8 mg/dL (ref 6–23)
Creat: 0.75 mg/dL (ref 0.50–1.10)

## 2012-06-06 NOTE — Progress Notes (Signed)
CMP,FLP AND TSH DONE TODAY Diane [name] 

## 2012-06-07 ENCOUNTER — Telehealth: Payer: Self-pay | Admitting: Family Medicine

## 2012-06-07 DIAGNOSIS — E039 Hypothyroidism, unspecified: Secondary | ICD-10-CM

## 2012-06-07 MED ORDER — LEVOTHYROXINE SODIUM 175 MCG PO TABS: 175.0000 ug | ORAL_TABLET | Freq: Every day | ORAL | Status: DC

## 2012-06-07 NOTE — Assessment & Plan Note (Signed)
TSh elevated despite no change in health and good compliance e(has only missed 2-3 pills in he past month).  Will increase dose from 150 to 175 and will recheck TSH in 6 weeks

## 2012-06-07 NOTE — Telephone Encounter (Signed)
TSh elevated despite no change in health and good compliance e(has only missed 2-3 pills in he past month).  Will increase dose from 150 to 175 and will recheck TSH in 6 weeks 

## 2012-06-13 ENCOUNTER — Ambulatory Visit: Payer: Medicaid Other

## 2012-09-12 ENCOUNTER — Other Ambulatory Visit: Payer: Self-pay | Admitting: Family Medicine

## 2012-10-15 ENCOUNTER — Other Ambulatory Visit: Payer: Self-pay | Admitting: Family Medicine

## 2012-10-18 ENCOUNTER — Telehealth: Payer: Self-pay | Admitting: Family Medicine

## 2012-10-18 NOTE — Telephone Encounter (Signed)
Wants to have thyroid levels b4 she increases dosage on synthroid Please advise

## 2012-10-18 NOTE — Telephone Encounter (Signed)
Her Thyroid checked in Jan was very high hence Dr Earnest Bailey increased her synthroid from 150 to 133mcg,what dose is she currently taking,she should be on from Jan and then recheck TSH 6wks after then. Please verify what dose she has been on since Jan.

## 2012-12-25 ENCOUNTER — Other Ambulatory Visit: Payer: Self-pay | Admitting: *Deleted

## 2012-12-25 MED ORDER — LEVOTHYROXINE SODIUM 175 MCG PO TABS: 175.0000 ug | ORAL_TABLET | Freq: Every day | ORAL | Status: DC

## 2012-12-25 NOTE — Telephone Encounter (Signed)
Pt called and vm left for appointment to be made for blood work and med review prior to refill Diane Haste, RN-BSN

## 2012-12-25 NOTE — Telephone Encounter (Signed)
Patient need appoint for thyroid check and medication review,please schedule appointment this week before medication refill can be completed.

## 2012-12-25 NOTE — Telephone Encounter (Signed)
Pt is requesting a refill on levothyroxine 175 mg. She is coming in 8/6 for her thyroid ck JW

## 2012-12-25 NOTE — Telephone Encounter (Signed)
Refill given

## 2012-12-25 NOTE — Telephone Encounter (Signed)
Pt informed. Raniah Karan Dawn  

## 2012-12-26 ENCOUNTER — Other Ambulatory Visit: Payer: Medicaid Other

## 2012-12-26 DIAGNOSIS — E039 Hypothyroidism, unspecified: Secondary | ICD-10-CM

## 2012-12-26 NOTE — Progress Notes (Signed)
TSH DONE TODAY Diane [name] 

## 2013-01-29 ENCOUNTER — Other Ambulatory Visit: Payer: Self-pay | Admitting: Family Medicine

## 2013-01-29 MED ORDER — LEVOTHYROXINE SODIUM 175 MCG PO TABS: 175.0000 ug | ORAL_TABLET | Freq: Every day | ORAL | Status: DC

## 2013-03-08 ENCOUNTER — Ambulatory Visit (INDEPENDENT_AMBULATORY_CARE_PROVIDER_SITE_OTHER): Payer: Medicaid Other | Admitting: *Deleted

## 2013-03-08 DIAGNOSIS — Z23 Encounter for immunization: Secondary | ICD-10-CM

## 2013-05-10 ENCOUNTER — Ambulatory Visit (INDEPENDENT_AMBULATORY_CARE_PROVIDER_SITE_OTHER): Payer: Medicaid Other | Admitting: Family Medicine

## 2013-05-10 ENCOUNTER — Encounter: Payer: Self-pay | Admitting: Family Medicine

## 2013-05-10 VITALS — BP 140/90 | HR 71 | Temp 98.1°F | Wt 251.0 lb

## 2013-05-10 DIAGNOSIS — R1013 Epigastric pain: Secondary | ICD-10-CM

## 2013-05-10 DIAGNOSIS — R140 Abdominal distension (gaseous): Secondary | ICD-10-CM

## 2013-05-10 DIAGNOSIS — R141 Gas pain: Secondary | ICD-10-CM

## 2013-05-10 MED ORDER — RANITIDINE HCL 150 MG PO TABS: 150.0000 mg | ORAL_TABLET | Freq: Two times a day (BID) | ORAL | Status: DC

## 2013-05-10 NOTE — Patient Instructions (Signed)
Bloating  Bloating is the feeling of fullness in your belly. You may feel as though your pants are too tight. Often the cause of bloating is overeating, retaining fluids, or having gas in your bowel. It is also caused by swallowing air and eating foods that cause gas. Irritable bowel syndrome is one of the most common causes of bloating. Constipation is also a common cause. Sometimes more serious problems can cause bloating.  SYMPTOMS   Usually there is a feeling of fullness, as though your abdomen is bulged out. There may be mild discomfort.   DIAGNOSIS   Usually no particular testing is necessary for most bloating. If the condition persists and seems to become worse, your caregiver may do additional testing.   TREATMENT   · There is no direct treatment for bloating.  · Do not put gas into the bowel. Avoid chewing gum and sucking on candy. These tend to make you swallow air. Swallowing air can also be a nervous habit. Try to avoid this.  · Avoiding high residue diets will help. Eat foods with soluble fibers (examples include root vegetables, apples, or barley) and substitute dairy products with soy and rice products. This helps irritable bowel syndrome.  · If constipation is the cause, then a high residue diet with more fiber will help.  · Avoid carbonated beverages.  · Over-the-counter preparations are available that help reduce gas. Your pharmacist can help you with this.  SEEK MEDICAL CARE IF:   · Bloating continues and seems to be getting worse.  · You notice a weight gain.  · You have a weight loss but the bloating is getting worse.  · You have changes in your bowel habits or develop nausea or vomiting.  SEEK IMMEDIATE MEDICAL CARE IF:   · You develop shortness of breath or swelling in your legs.  · You have an increase in abdominal pain or develop chest pain.  Document Released: 03/09/2006 Document Revised: 08/01/2011 Document Reviewed: 04/27/2007  ExitCare® Patient Information ©2014 [employer].

## 2013-05-10 NOTE — Progress Notes (Signed)
Patient ID: Diane [name], female   DOB: 1970-09-02, 42 y.o.   MRN: [id number] FAMILY MEDICINE OFFICE NOTE  Chief Complaint:  Nausea, abdominal pain, bloating.   Primary Care Physician: Janit Pagan, MD  HPI:  Diane [name] is a 42 yo here for [facility] for low back pain, abdominal pain and bloating. Also having some nausea. Took 3 gas-x and seem to not be helping some.  Has had these symptoms before and was told it was gas Just ahd a bowel movement and that seemed to help some  Pain is coming and going. Seems to be getting some better.  Has had some nausea  No fevers, chills, vomiting, diarrhea, constipation, dysuria, hematuria, SOB, CP. No hematochezia, melena, abnormal weight gain or loss.    PMHx:  Past Medical History  Diagnosis Date  . Depression   . Thyroid disease     No past surgical history on file.  FAMHx:  Family History  Problem Relation Age of Onset  . Hypertension Mother   . Heart disease Father 67    SOCHx:   reports that she has quit smoking. She quit smokeless tobacco use about 2 years ago. Her alcohol and drug histories are not on file.  ALLERGIES:  No Known Allergies  ROS: Pertinent ROS as seen in HPI. Otherwise negative.   HOME MEDS: Current Outpatient Prescriptions  Medication Sig Dispense Refill  . levothyroxine (SYNTHROID, LEVOTHROID) 175 MCG tablet Take 1 tablet (175 mcg total) by mouth daily before breakfast.  30 tablet  2   No current facility-administered medications for this visit.    LABS/IMAGING: No results found for this or any previous visit (from the past 48 hour(s)). No results found.  VITALS: BP 161/100  Pulse 71  Temp(Src) 98.1 F (36.7 C) (Oral)  Wt 113.853 kg (251 lb)  LMP 04/10/2013  EXAM: Gen: NAD, well appearing HEENT: oropharynx clear, pupils equal and reactive PULM: LCTAB, no wheezes/rhonchi/rales CV: RRR, no murmurs ABD: soft,  ND, nbsx4. Some mild epigastric tenderness, no rebound.   BACK: nontender,  no abnormalities on inspection, palpation or ROM.  EXT: 2+ DP pulses, no edema    ASSESSMENT: Abdominal pain, epigastric  Bloating  PLAN: - agree that this is most likely gas or dyspepsia - no current reason to suspect ulcer or other perforation - symptoms already improving - trial of zantac - f/u if worsening pain or any changes.   - would recommend labwork at that time.

## 2013-05-12 ENCOUNTER — Other Ambulatory Visit: Payer: Self-pay | Admitting: Family Medicine

## 2013-07-03 ENCOUNTER — Emergency Department (HOSPITAL_COMMUNITY)
Admission: EM | Admit: 2013-07-03 | Discharge: 2013-07-03 | Disposition: A | Discharge status: Home / Self Care | Payer: Medicaid Other | Source: Home / Self Care | Attending: Family Medicine | Admitting: Family Medicine

## 2013-07-03 ENCOUNTER — Ambulatory Visit (HOSPITAL_COMMUNITY)
Admit: 2013-07-03 | Discharge: 2013-07-03 | Disposition: A | Discharge status: Home / Self Care | Payer: Medicaid Other | Attending: Family Medicine | Admitting: Family Medicine

## 2013-07-03 ENCOUNTER — Encounter (HOSPITAL_COMMUNITY): Payer: Self-pay | Admitting: Emergency Medicine

## 2013-07-03 ENCOUNTER — Emergency Department (HOSPITAL_COMMUNITY)
Admission: EM | Admit: 2013-07-03 | Discharge: 2013-07-04 | Disposition: A | Discharge status: Home / Self Care | Payer: Medicaid Other | Attending: Emergency Medicine | Admitting: Emergency Medicine

## 2013-07-03 DIAGNOSIS — I2699 Other pulmonary embolism without acute cor pulmonale: Secondary | ICD-10-CM | POA: Insufficient documentation

## 2013-07-03 DIAGNOSIS — M79609 Pain in unspecified limb: Secondary | ICD-10-CM

## 2013-07-03 DIAGNOSIS — R0602 Shortness of breath: Secondary | ICD-10-CM | POA: Insufficient documentation

## 2013-07-03 DIAGNOSIS — M25519 Pain in unspecified shoulder: Secondary | ICD-10-CM | POA: Insufficient documentation

## 2013-07-03 DIAGNOSIS — Z87891 Personal history of nicotine dependence: Secondary | ICD-10-CM | POA: Insufficient documentation

## 2013-07-03 DIAGNOSIS — Z79899 Other long term (current) drug therapy: Secondary | ICD-10-CM | POA: Insufficient documentation

## 2013-07-03 DIAGNOSIS — E669 Obesity, unspecified: Secondary | ICD-10-CM

## 2013-07-03 DIAGNOSIS — Z8659 Personal history of other mental and behavioral disorders: Secondary | ICD-10-CM | POA: Insufficient documentation

## 2013-07-03 DIAGNOSIS — IMO0001 Reserved for inherently not codable concepts without codable children: Secondary | ICD-10-CM | POA: Insufficient documentation

## 2013-07-03 DIAGNOSIS — M7989 Other specified soft tissue disorders: Secondary | ICD-10-CM

## 2013-07-03 DIAGNOSIS — Z3202 Encounter for pregnancy test, result negative: Secondary | ICD-10-CM | POA: Insufficient documentation

## 2013-07-03 DIAGNOSIS — I82409 Acute embolism and thrombosis of unspecified deep veins of unspecified lower extremity: Secondary | ICD-10-CM

## 2013-07-03 DIAGNOSIS — Z720 Tobacco use: Secondary | ICD-10-CM

## 2013-07-03 DIAGNOSIS — E079 Disorder of thyroid, unspecified: Secondary | ICD-10-CM | POA: Insufficient documentation

## 2013-07-03 DIAGNOSIS — R079 Chest pain, unspecified: Secondary | ICD-10-CM | POA: Insufficient documentation

## 2013-07-03 LAB — COMPREHENSIVE METABOLIC PANEL
ALK PHOS: 56 U/L (ref 39–117)
ALT: 26 U/L (ref 0–35)
AST: 23 U/L (ref 0–37)
Albumin: 3.9 g/dL (ref 3.5–5.2)
BUN: 8 mg/dL (ref 6–23)
CO2: 23 meq/L (ref 19–32)
Calcium: 9.2 mg/dL (ref 8.4–10.5)
Chloride: 99 mEq/L (ref 96–112)
Creatinine, Ser: 0.69 mg/dL (ref 0.50–1.10)
GFR calc non Af Amer: >90 mL/min (ref >90)
GLUCOSE: 90 mg/dL (ref 70–99)
POTASSIUM: 4.3 meq/L (ref 3.7–5.3)
Sodium: 137 mEq/L (ref 137–147)
Total Bilirubin: 0.3 mg/dL (ref 0.3–1.2)
Total Protein: 8 g/dL (ref 6.0–8.3)

## 2013-07-03 LAB — CBC
HEMATOCRIT: 40.8 % (ref 36.0–46.0)
Hemoglobin: 13.3 g/dL (ref 12.0–15.0)
MCH: 26.1 pg (ref 26.0–34.0)
MCHC: 32.6 g/dL (ref 30.0–36.0)
MCV: 80.2 fL (ref 78.0–100.0)
Platelets: 254 10*3/uL (ref 150–400)
RBC: 5.09 MIL/uL (ref 3.87–5.11)
RDW: 13.8 % (ref 11.5–15.5)
WBC: 10.8 10*3/uL — ABNORMAL HIGH (ref 4.0–10.5)

## 2013-07-03 LAB — APTT: aPTT: 29 seconds (ref 24–37)

## 2013-07-03 LAB — PROTIME-INR
INR: 1 (ref 0.00–1.49)
Prothrombin Time: 13 seconds (ref 11.6–15.2)

## 2013-07-03 MED ORDER — RIVAROXABAN 15 MG PO TABS
15.0000 mg | ORAL_TABLET | Freq: Once | ORAL | Status: AC
  Administered 2013-07-04: 15 mg via ORAL
  Filled 2013-07-03: qty 1

## 2013-07-03 MED ORDER — RIVAROXABAN (XARELTO) EDUCATION KIT FOR DVT/PE PATIENTS
PACK | Freq: Once | Status: AC
  Administered 2013-07-04: 02:00:00
  Filled 2013-07-03: qty 1

## 2013-07-03 NOTE — Progress Notes (Signed)
Bilateral lower extremity venous duplex completed.  Right:  DVT noted in the distal femoral, popliteal, posterior tibial, and peroneal veins.  No evidence of superficial thrombosis or Baker's cyst.  Left:  No evidence of DVT, superficial thrombosis, or Baker's cyst.

## 2013-07-03 NOTE — ED Provider Notes (Signed)
Medical screening examination/treatment/procedure(s) were performed by resident physician or non-physician practitioner and as supervising physician I was immediately available for consultation/collaboration.   Emireth Cockerham DOUGLAS MD.   Wadsworth Skolnick D Dericka Ostenson, MD 07/03/13 2003 

## 2013-07-03 NOTE — ED Notes (Signed)
Pain and swelling  in right calf area for past 2 days. No known injury. Started after had been walking on treadmill. C/o calf feels tight and swollen, hurts to walk, squat

## 2013-07-03 NOTE — Discharge Instructions (Addendum)
Information on my medicine - XARELTO (rivaroxaban)  This medication education was reviewed with me or my healthcare representative as part of my discharge preparation.  The pharmacist that spoke with me during my hospital stay was:  Abbott, Gary Fleet, [facility]  WHY WAS Diane [name] PRESCRIBED FOR YOU? Xarelto was prescribed to treat blood clots that may have been found in the veins of your legs (deep vein thrombosis) or in your lungs (pulmonary embolism) and to reduce the risk of them occurring again.  What do you need to know about Xarelto? The starting dose is one 15 mg tablet taken TWICE daily with food for the FIRST 21 DAYS then on (enter date)  3/5  the dose is changed to one 20 mg tablet taken ONCE A DAY with your evening meal.  DO NOT stop taking Xarelto without talking to the health care provider who prescribed the medication.  Refill your prescription for 20 mg tablets before you run out.  After discharge, you should have regular check-up appointments with your healthcare provider that is prescribing your Xarelto.  In the future your dose may need to be changed if your kidney function changes by a significant amount.  What do you do if you miss a dose? If you are taking Xarelto TWICE DAILY and you miss a dose, take it as soon as you remember. You may take two 15 mg tablets (total 30 mg) at the same time then resume your regularly scheduled 15 mg twice daily the next day.  If you are taking Xarelto ONCE DAILY and you miss a dose, take it as soon as you remember on the same day then continue your regularly scheduled once daily regimen the next day. Do not take two doses of Xarelto at the same time.   Important Safety Information Xarelto is a blood thinner medicine that can cause bleeding. You should call your healthcare provider right away if you experience any of the following:   Bleeding from an injury or your nose that does not stop.   Unusual colored urine (red or dark brown) or  unusual colored stools (red or black).   Unusual bruising for unknown reasons.   A serious fall or if you hit your head (even if there is no bleeding).  Some medicines may interact with Xarelto and might increase your risk of bleeding while on Xarelto. To help avoid this, consult your healthcare provider or pharmacist prior to using any new prescription or non-prescription medications, including herbals, vitamins, non-steroidal anti-inflammatory drugs (NSAIDs) and supplements.  This website has more information on Xarelto: VisitDestination.com.br. Pulmonary Embolus A pulmonary (lung) embolus (PE) is a blood clot that has traveled from another place in the body to the lung. Most clots come from deep veins in the legs or pelvis. PE is a dangerous and potentially life-threatening condition that can be treated if identified. CAUSES Blood clots form in a vein for different reasons. Usually several things cause blood clots. They include:  The flow of blood slows down.  The inside of the vein is damaged in some way.  The person has a condition that makes the blood clot more easily. These conditions may include:  Older age (especially over 74 years old).  Having a history of blood clots.  Having major or lengthy surgery. Hip surgery is particularly high-risk.  Breaking a hip or leg.  Sitting or lying still for a long time.  Cancer or cancer treatment.  Having a long, thin tube (catheter) placed inside a vein  during a medical procedure.  Being overweight (obese).  Pregnancy and childbirth.  Medicines with estrogen.  Smoking.  Other circulation or heart problems. SYMPTOMS  The symptoms of a PE usually start suddenly and include:  Shortness of breath.  Coughing.  Coughing up blood or blood-tinged mucus (phlegm).  Chest pain. Pain is often worse with deep breaths.  Rapid heartbeat. DIAGNOSIS  If a PE is suspected, your caregiver will take a medical history and carry out a  physical exam. Your caregiver will check for the risk factors listed above. Tests that also may be required include:  Blood tests, including studies of the clotting properties of your blood.  Imaging tests. Ultrasound, CT, MRI, and other tests can all be used to see if you have clots in your legs or lungs. If you have a clot in your legs and have breathing or chest problems, your caregiver may conclude that you have a clot in your lungs. Further lung tests may not be needed.  Electrocardiography can look for heart strain from blood clots in the lungs. PREVENTION   Exercise the legs regularly. Take a brisk 30 minute walk every day.  Maintain a weight that is appropriate for your height.  Avoid sitting or lying in bed for long periods of time without moving your legs.  Women, particularly those over the age of 42, should consider the risks and benefits of taking estrogen medicines, including birth control pills.  Do not smoke, especially if you take estrogen medicines.  Long-distance travel can increase your risk. You should exercise your legs by walking or pumping the muscles every hour.  In hospital prevention:  Your caregiver will assess your need for preventive PE care (prophylaxis) when you are admitted to the hospital. If you are having surgery, your surgeon will assess you the day of or day after surgery.  Prevention may include medical and nonmedical measures. TREATMENT   The most common treatment for a PE is blood thinning (anticoagulant) medicine, which reduces the blood's tendency to clot. Anticoagulants can stop new blood clots from forming and old ones from growing. They cannot dissolve existing clots. Your body does this by itself over time. Anticoagulants can be given by mouth, by intravenous (IV) access, or by injection. Your caregiver will determine the best program for you.  Less commonly, clot-dissolving drugs (thrombolytics) are used to dissolve a PE. They carry a high  risk of bleeding, so they are used mainly in severe cases.  Very rarely, a blood clot in the leg needs to be removed surgically.  If you are unable to take anticoagulants, your caregiver may arrange for you to have a filter placed in a main vein in your abdomen. This filter prevents clots from traveling to your lungs. HOME CARE INSTRUCTIONS   Take all medicines prescribed by your caregiver. Follow the directions carefully.  Warfarin. Most people will continue taking warfarin after hospital discharge. Your caregiver will advise you on the length of treatment (usually 3 6 months, sometimes lifelong).  Too much and too little warfarin are both dangerous. Too much warfarin increases the risk of bleeding. Too little warfarin continues to allow the risk for blood clots. While taking warfarin, you will need to have regular blood tests to measure your blood clotting time. These blood tests usually include both the prothrombin time (PT) and International Normalized Ratio (INR) tests. The PT and INR results allow your caregiver to adjust your dose of warfarin. The dose can change for many reasons. It is  critically important that you take warfarin exactly as prescribed, and that you have your PT and INR levels drawn exactly as directed.  Many foods, especially foods high in vitamin K can interfere with warfarin and affect the PT and INR results. Foods high in vitamin K include spinach, kale, broccoli, cabbage, collard and turnip greens, brussels sprouts, peas, cauliflower, seaweed, and parsley as well as beef and pork liver, green tea, and soybean oil. You should eat a consistent amount of foods high in vitamin K. Avoid major changes in your diet, or notify your caregiver before changing your diet. Arrange a visit with a dietitian to answer your questions.  Many medicines can interfere with warfarin and affect the PT and INR results. You must tell your caregiver about any and all medicines you take, this  includes all vitamins and supplements. Be especially cautious with aspirin and anti-inflammatory medicines. Ask your caregiver before taking these. Do not take or discontinue any prescribed or over-the-counter medicine except on the advice of your caregiver or pharmacist.  Warfarin can have side effects, such as excessive bruising or bleeding. You will need to hold pressure over cuts for longer than usual.  Alcohol can change the body's ability to handle warfarin. It is best to avoid alcoholic drinks or consume only very small amounts while taking warfarin. Notify your caregiver if you change your alcohol intake.  Notify your dentist or other caregivers before procedures.  Avoid contact sports.  Wear a medical alert bracelet or carry a medical alert card.  Ask your caregiver how soon you can go back to normal activities. Not being active can lead to new clots. Ask for a list of what you should and should not do.  Compression stockings. These are tight elastic stockings that apply pressure to the lower legs. This can help keep the blood in the legs from clotting. You may need to wear compressions stockings at home to help prevent clots.  Smoking. If you smoke, quit. Ask your caregiver for help with quitting smoking.  Learn as much as you can about PE. Educating yourself can help prevent PE from reoccurring. SEEK MEDICAL CARE IF:   You notice a rapid heartbeat.  You feel weaker or more tired than usual.  You feel faint.  You notice increased bruising.  Your symptoms are not getting better in the time expected.  You are having side effects of medicine. SEEK IMMEDIATE MEDICAL CARE IF:   You have chest pain.  You have trouble breathing.  You have new or increased swelling or pain in one leg.  You cough up blood.  You notice blood in vomit, in a bowel movement, or in urine.  You have an oral temperature above 102 F (38.9 C), not controlled by medicine. You may have another  PE. A blood clot in the lungs is a medical emergency. Call your local emergency services (911 in [place] to get to the nearest hospital or clinic. Do not drive yourself. MAKE SURE YOU:   Understand these instructions.  Will watch your condition.  Will get help right away if you are not doing well or get worse. Document Released: 05/06/2000 Document Revised: 11/08/2011 Document Reviewed: 11/10/2008 [facility] Patient Information 2014 [place], [place].

## 2013-07-03 NOTE — ED Provider Notes (Signed)
CSN: [id number]     Arrival date & time 07/03/13  1408 History   First MD Initiated Contact with Patient 07/03/13 1606     Chief Complaint  Patient presents with  . Leg Pain     (Consider location/radiation/quality/duration/timing/severity/associated sxs/prior Treatment) HPI Comments: Patient presents with a 2-3 day history or right calf pain and swelling. Does not recall specific injury or previous episode. States calf feels swollen, "tight" and uncomfortable and "achy." States symptoms have become progressively worse over past 2-3 days. No changes in strength or sensation. No known coagulopathy. Denies chest pain or dyspnea.  PCP: [facility]  Patient is a 43 y.o. female presenting with leg pain. The history is provided by the patient.  Leg Pain   Past Medical History  Diagnosis Date  . Depression   . Thyroid disease    History reviewed. No pertinent past surgical history. Family History  Problem Relation Age of Onset  . Hypertension Mother   . Heart disease Father 27   History  Substance Use Topics  . Smoking status: Former Games developer  . Smokeless tobacco: Former Neurosurgeon    Quit date: 12/23/2010     Comment: has quitt in 10-11 and re-started smoking again  . Alcohol Use: Not on file   OB History   Grav Para Term Preterm Abortions TAB SAB Ect Mult Living                 Review of Systems  All other systems reviewed and are negative.      Allergies  Review of patient's allergies indicates no known allergies.  Home Medications   Current Outpatient Rx  Name  Route  Sig  Dispense  Refill  . levothyroxine (SYNTHROID, LEVOTHROID) 175 MCG tablet      TAKE ONE TABLET BY MOUTH ONCE DAILY BEFORE BREAKFAST.   30 tablet   2     Patient need follow up for subsequent refill.   . ranitidine (ZANTAC) 150 MG tablet   Oral   Take 1 tablet (150 mg total) by mouth 2 (two) times daily.   60 tablet   1    BP 141/92  Pulse 88  Temp(Src) 97.7 F (36.5 C) (Oral)  Resp 16  SpO2  99% Physical Exam  Nursing note and vitals reviewed. Constitutional: She is oriented to person, place, and time. She appears well-developed and well-nourished. No distress.  HENT:  Head: Normocephalic and atraumatic.  Cardiovascular: Normal rate, regular rhythm and normal heart sounds.   Pulmonary/Chest: Effort normal and breath sounds normal.  Musculoskeletal: She exhibits edema and tenderness.       Legs: Area of discomfort. CSM exam of RLE intact  Neurological: She is alert and oriented to person, place, and time.  Skin: Skin is warm and dry. No rash noted. No erythema.  Psychiatric: She has a normal mood and affect. Her behavior is normal.    ED Course  Procedures (including critical care time) Labs Review Labs Reviewed - No data to display Imaging Review No results found.    MDM   Final diagnoses:  None  Case discussed with Dr. Artis Flock.  History and exam suggestive of RLE DVT. Patient sent to [facility] via shuttle for RLE doppler U/S study and call report to be provided to Dr. Artis Flock when result available. If DVT+, will need to begin either rivaroxaban or enoxaparin therapy. If no DVT, treat with NSAID of choice, rest, ice and elevation with PCP follow up if no improvement.  Jess BartersJennifer Lee Bunker Hill[place], [place] 07/03/13 [id number]

## 2013-07-03 NOTE — ED Notes (Addendum)
[facility] for + DVT to R lower leg.   C/o pain and swelling to R calf x 2 days.

## 2013-07-04 ENCOUNTER — Ambulatory Visit (INDEPENDENT_AMBULATORY_CARE_PROVIDER_SITE_OTHER): Payer: Medicaid Other | Admitting: Family Medicine

## 2013-07-04 ENCOUNTER — Encounter (HOSPITAL_COMMUNITY): Payer: Self-pay | Admitting: Radiology

## 2013-07-04 ENCOUNTER — Encounter: Payer: Self-pay | Admitting: Family Medicine

## 2013-07-04 ENCOUNTER — Emergency Department (HOSPITAL_COMMUNITY): Payer: Medicaid Other

## 2013-07-04 VITALS — BP 126/79 | HR 80 | Temp 98.0°F | Wt 244.0 lb

## 2013-07-04 DIAGNOSIS — I2699 Other pulmonary embolism without acute cor pulmonale: Secondary | ICD-10-CM

## 2013-07-04 DIAGNOSIS — I824Y9 Acute embolism and thrombosis of unspecified deep veins of unspecified proximal lower extremity: Secondary | ICD-10-CM

## 2013-07-04 LAB — POCT PREGNANCY, URINE: Preg Test, Ur: NEGATIVE

## 2013-07-04 MED ORDER — IOHEXOL 350 MG/ML SOLN
100.0000 mL | Freq: Once | INTRAVENOUS | Status: AC | PRN
Start: 2013-07-04 — End: 2013-07-04
  Administered 2013-07-04: 100 mL via INTRAVENOUS

## 2013-07-04 MED ORDER — XARELTO VTE STARTER PACK 15 & 20 MG PO TBPK: 15.0000 mg | ORAL_TABLET | ORAL | Status: DC

## 2013-07-04 MED ORDER — OXYCODONE-ACETAMINOPHEN 5-325 MG PO TABS
1.0000 | ORAL_TABLET | Freq: Once | ORAL | Status: AC
  Administered 2013-07-04: 1 via ORAL
  Filled 2013-07-04: qty 1

## 2013-07-04 MED ORDER — OXYCODONE-ACETAMINOPHEN 5-325 MG PO TABS: 1.0000 | ORAL_TABLET | ORAL | Status: DC | PRN

## 2013-07-04 NOTE — Assessment & Plan Note (Signed)
No evidence of hypoxemia with O2 sat of 94% today and explained to pt that she has a ventilation defect that she is unable to oxygenate all of her blood secondary to this.  Please see DVT for the rest of the A/P.

## 2013-07-04 NOTE — Progress Notes (Signed)
Diane NorfolkKeri L [name] is a 43 y.o. female who presents today for RLE acute unprovoked DVT and B/L PE w/o heart strain.  Pt with new onset RLE DVT, unprovoked, as she denies smoking (quit one yr ago), no birth control, no recent travel, no known CA, recent surgery or hospitalization.  Currently having some DOE but no dyspnea at rest.  Pain in posterior calf with +1 edema and pain with ambulation.  Started on Xarelto yesterday, no questions about taking the medication.  FHx does include father who had DVT in his 4040s, unprovoked.       Past Medical History  Diagnosis Date  . Depression   . Thyroid disease     History  Smoking status  . Former Smoker  Smokeless tobacco  . Former NeurosurgeonUser  . Quit date: 12/23/2010    Comment: has quitt in 10-11 and re-started smoking again    Family History  Problem Relation Age of Onset  . Hypertension Mother   . Heart disease Father 1645    Current Outpatient Prescriptions on File Prior to Visit  Medication Sig Dispense Refill  . CALCIUM PO Take 1 tablet by mouth daily.      Marland Kitchen. levothyroxine (SYNTHROID, LEVOTHROID) 175 MCG tablet TAKE ONE TABLET BY MOUTH ONCE DAILY BEFORE BREAKFAST.  30 tablet  2  . oxyCODONE-acetaminophen (PERCOCET/ROXICET) 5-325 MG per tablet Take 1 tablet by mouth every 4 (four) hours as needed for severe pain.  30 tablet  0  . XARELTO STARTER PACK 15 & 20 MG TBPK Take 15-20 mg by mouth as directed. Take as directed on package: Start with one 15mg  tablet by mouth twice a day with food. On Day 22, switch to one 20mg  tablet once a day with food.  51 each  0   No current facility-administered medications on file prior to visit.    ROS: Per HPI.  All other systems reviewed and are negative.   Physical Exam Filed Vitals:   07/04/13 1043  BP: 126/79  Pulse: 80  Temp: 98 F (36.7 C)  O2 Sat - 94% at rest   Physical Examination: General appearance - alert, well appearing, and in no distress Chest - clear to auscultation, no wheezes, rales  or rhonchi, symmetric air entry Heart - normal rate and regular rhythm, no murmurs noted Extremities - pedal edema 1 + on R, intact peripheral pulses, Homan's sign positive on R Skin - normal coloration and turgor, no rashes, no suspicious skin lesions noted    Chemistry      Component Value Date/Time   NA 137 07/03/2013 1723   K 4.3 07/03/2013 1723   CL 99 07/03/2013 1723   CO2 23 07/03/2013 1723   BUN 8 07/03/2013 1723   CREATININE 0.69 07/03/2013 1723   CREATININE 0.75 06/06/2012 0916      Component Value Date/Time   CALCIUM 9.2 07/03/2013 1723   ALKPHOS 56 07/03/2013 1723   AST 23 07/03/2013 1723   ALT 26 07/03/2013 1723   BILITOT 0.3 07/03/2013 1723      Lab Results  Component Value Date   WBC 10.8* 07/03/2013   HGB 13.3 07/03/2013   HCT 40.8 07/03/2013   MCV 80.2 07/03/2013   PLT 254 07/03/2013

## 2013-07-04 NOTE — Patient Instructions (Signed)
Deep Vein Thrombosis  A deep vein thrombosis (DVT) is a blood clot that develops in the deep, larger veins of the leg, arm, or pelvis. These are more dangerous than clots that might form in veins near the surface of the body. A DVT can lead to complications if the clot breaks off and travels in the bloodstream to the lungs.   A DVT can damage the valves in your leg veins, so that instead of flowing upward, the blood pools in the lower leg. This is called post-thrombotic syndrome, and it can result in pain, swelling, discoloration, and sores on the leg.  CAUSES  Usually, several things contribute to blood clots forming. Contributing factors include:   The flow of blood slows down.   The inside of the vein is damaged in some way.   You have a condition that makes blood clot more easily.  RISK FACTORS  Some people are more likely than others to develop blood clots. Risk factors include:    Older age, especially over 75 years of age.   Having a family history of blood clots or if you have already had a blot clot.   Having major or lengthy surgery. This is especially true for surgery on the hip, knee, or belly (abdomen). Hip surgery is particularly high risk.   Breaking a hip or leg.   Sitting or lying still for a long time. This includes long-distance travel, paralysis, or recovery from an illness or surgery.   Having cancer or cancer treatment.   Having a long, thin tube (catheter) placed inside a vein during a medical procedure.   Being overweight (obese).   Pregnancy and childbirth.   Hormone changes make the blood clot more easily during pregnancy.   The fetus puts pressure on the veins of the pelvis.   There is a risk of injury to veins during delivery or a caesarean. The risk is highest just after childbirth.   Medicines with the female hormone estrogen. This includes birth control pills and hormone replacement therapy.   Smoking.   Other circulation or heart problems.    SIGNS AND SYMPTOMS  When  a clot forms, it can either partially or totally block the blood flow in that vein. Symptoms of a DVT can include:   Swelling of the leg or arm, especially if one side is much worse.   Warmth and redness of the leg or arm, especially if one side is much worse.   Pain in an arm or leg. If the clot is in the leg, symptoms may be more noticeable or worse when standing or walking.  The symptoms of a DVT that has traveled to the lungs (pulmonary embolism, PE) usually start suddenly and include:   Shortness of breath.   Coughing.   Coughing up blood or blood-tinged phlegm.   Chest pain. The chest pain is often worse with deep breaths.   Rapid heartbeat.  Anyone with these symptoms should get emergency medical treatment right away. Call your local emergency services (911 in the [place] if you have these symptoms.  DIAGNOSIS  If a DVT is suspected, your health care provider will take a full medical history and perform a physical exam. Tests that also may be required include:   Blood tests, including studies of the clotting properties of the blood.   Ultrasonography to see if you have clots in your legs or lungs.   X-rays to show the flow of blood when dye is injected into the veins (  venography).   Studies of your lungs if you have any chest symptoms.  PREVENTION   Exercise the legs regularly. Take a brisk 30-minute walk every day.   Maintain a weight that is appropriate for your height.   Avoid sitting or lying in bed for long periods of time without moving your legs.   Women, particularly those over the age of 35 years, should consider the risks and benefits of taking estrogen medicines, including birth control pills.   Do not smoke, especially if you take estrogen medicines.   Long-distance travel can increase your risk of DVT. You should exercise your legs by walking or pumping the muscles every hour.   In-hospital prevention:   Many of the risk factors above relate to situations that exist with  hospitalization, either for illness, injury, or elective surgery.   Your health care provider will assess you for the need for venous thromboembolism prophylaxis when you are admitted to the hospital. If you are having surgery, your surgeon will assess you the day of or day after surgery.   Prevention may include medical and nonmedical measures.  TREATMENT  Once identified, a DVT can be treated. It can also be prevented in some circumstances. Once you have had a DVT, you may be at increased risk for a DVT in the future. The most common treatment for DVT is blood thinning (anticoagulant) medicine, which reduces the blood's tendency to clot. Anticoagulants can stop new blood clots from forming and stop old ones from growing. They cannot dissolve existing clots. Your body does this by itself over time. Anticoagulants can be given by mouth, by IV access, or by injection. Your health care provider will determine the best program for you. Other medicines or treatments that may be used are:   Heparin or related medicines (low molecular weight heparin) are usually the first treatment for a blood clot. They act quickly. However, they cannot be taken orally.   Heparin can cause a fall in a component of blood that stops bleeding and forms blood clots (platelets). You will be monitored with blood tests to be sure this does not occur.   Warfarin is an anticoagulant that can be swallowed. It takes a few days to start working, so usually heparin or related medicines are used in combination. Once warfarin is working, heparin is usually stopped.   Less commonly, clot dissolving drugs (thrombolytics) are used to dissolve a DVT. They carry a high risk of bleeding, so they are used mainly in severe cases, where your life or a limb is threatened.   Very rarely, a blood clot in the leg needs to be removed surgically.   If you are unable to take anticoagulants, your health care provider may arrange for you to have a filter placed  in a main vein in your abdomen. This filter prevents clots from traveling to your lungs.  HOME CARE INSTRUCTIONS   Take all medicines prescribed by your health care provider. Only take over-the-counter or prescription medicines for pain, fever, or discomfort as directed by your health care provider.   Warfarin. Most people will continue taking warfarin after hospital discharge. Your health care provider will advise you on the length of treatment (usually 3 6 months, sometimes lifelong).   Too much and too little warfarin are both dangerous. Too much warfarin increases the risk of bleeding. Too little warfarin continues to allow the risk for blood clots. While taking warfarin, you will need to have regular blood tests to measure your   blood clotting time. These blood tests usually include both the prothrombin time (PT) and international normalized ratio (INR) tests. The PT and INR results allow your health care provider to adjust your dose of warfarin. The dose can change for many reasons. It is critically important that you take warfarin exactly as prescribed, and that you have your PT and INR levels drawn exactly as directed.   Many foods, especially foods high in vitamin K, can interfere with warfarin and affect the PT and INR results. Foods high in vitamin K include spinach, kale, broccoli, cabbage, collard and turnip greens, brussel sprouts, peas, cauliflower, seaweed, and parsley as well as beef and pork liver, green tea, and soybean oil. You should eat a consistent amount of foods high in vitamin K. Avoid major changes in your diet, or notify your health care provider before changing your diet. Arrange a visit with a dietitian to answer your questions.   Many medicines can interfere with warfarin and affect the PT and INR results. You must tell your health care provider about any and all medicines you take. This includes all vitamins and supplements. Be especially cautious with aspirin and  anti-inflammatory medicines. Ask your health care provider before taking these. Do not take or discontinue any prescribed or over-the-counter medicine except on the advice of your health care provider or pharmacist.   Warfarin can have side effects, primarily excessive bruising or bleeding. You will need to hold pressure over cuts for longer than usual. Your health care provider or pharmacist will discuss other potential side effects.   Alcohol can change the body's ability to handle warfarin. It is best to avoid alcoholic drinks or consume only very small amounts while taking warfarin. Notify your health care provider if you change your alcohol intake.   Notify your dentist or other health care providers before procedures.   Activity. Ask your health care provider how soon you can go back to normal activities. It is important to stay active to prevent blood clots. If you are on anticoagulant medicine, avoid contact sports.   Exercise. It is very important to exercise. This is especially important while traveling, sitting, or standing for long periods of time. Exercise your legs by walking or by pumping the muscles frequently. Take frequent walks.   Compression stockings. These are tight elastic stockings that apply pressure to the lower legs. This pressure can help keep the blood in the legs from clotting. You may need to wear compression stockings at home to help prevent a DVT.   Do not smoke. If you smoke, quit. Ask your health care provider for help with quitting smoking.   Learn as much as you can about DVT. Knowing more about the condition should help you keep it from coming back.   Wear a medical alert bracelet or carry a medical alert card.  SEEK MEDICAL CARE IF:   You notice a rapid heartbeat.   You feel weaker or more tired than usual.   You feel faint.   You notice increased bruising.   You feel your symptoms are not getting better in the time expected.   You believe you are having side  effects of medicine.  SEEK IMMEDIATE MEDICAL CARE IF:   You have chest pain.   You have trouble breathing.   You have new or increased swelling or pain in one leg.   You cough up blood.   You notice blood in vomit, in a bowel movement, or in urine.  MAKE SURE   YOU:   Understand these instructions.   Will watch your condition.   Will get help right away if you are not doing well or get worse.  Document Released: 05/09/2005 Document Revised: 02/27/2013 Document Reviewed: 01/14/2013  [employer] Patient Information 2014 [employer].

## 2013-07-04 NOTE — Assessment & Plan Note (Addendum)
Pt with new onset RLE DVT, unprovoked, as she denies smoking (quit one yr ago), no birth control, no recent travel, no known CA, recent surgery or hospitalization.  Currently having some DOE but no dyspnea at rest.  Pain in posterior calf with +1 edema and pain with ambulation.  Started on Xarelto yesterday, no questions about taking the medication.  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  If this is positive, she will most likely benefit from lifelong anticoagulation.  If this is negative, will need discussion at 3 months to discuss risks vs benefits of discontinuing anticoagulation as her risk is 10% at 1 yr after d/c and 30% at 5 yrs for reoccurrence.

## 2013-07-04 NOTE — ED Provider Notes (Signed)
Medical screening examination/treatment/procedure(s) were conducted as a shared visit with non-physician practitioner(s) and myself.  I personally evaluated the patient during the encounter.  Pt with DVT dx today, also with shortness of breath.  Bilateral PE without strain on CTA chest.  Hemodynamically stable, has been ambulatory without difficulty here.  Have started on Xarelto.  D/w pcp, [facility] resident, and they will follow her closely, do not feel she needs admission.  Olivia Mackielga M Sharece Fleischhacker, MD 07/04/13 [id number]

## 2013-07-04 NOTE — ED Provider Notes (Signed)
CSN: [id number]     Arrival date & time 07/03/13  1710 History   First MD Initiated Contact with Patient 07/03/13 2307     Chief Complaint  Patient presents with  . DVT     (Consider location/radiation/quality/duration/timing/severity/associated sxs/prior Treatment) HPI Comments: Patient here after having been seen at the [facility] for 2-3 day history of RLE calf swelling and pain - she states that the leg has been tight and swelling - she denies any injury, she denies previous history of DVT, states + family history of same.  She initially denied chest pain or shortness of breath but has now stated that she is having pain between her shoulder blades and some mild shortness of breath, she has a known politeal and femoral DVT.  She denies any long travel, protein C or S deficiency, injury to the leg.    The history is provided by the patient. No language interpreter was used.    Past Medical History  Diagnosis Date  . Depression   . Thyroid disease    History reviewed. No pertinent past surgical history. Family History  Problem Relation Age of Onset  . Hypertension Mother   . Heart disease Father 59   History  Substance Use Topics  . Smoking status: Former Research scientist (life sciences)  . Smokeless tobacco: Former Systems developer    Quit date: 12/23/2010     Comment: has quitt in 10-11 and re-started smoking again  . Alcohol Use: No   OB History   Grav Para Term Preterm Abortions TAB SAB Ect Mult Living                 Review of Systems  Respiratory: Positive for shortness of breath.   Cardiovascular: Positive for chest pain and leg swelling.  Musculoskeletal: Positive for gait problem and myalgias. Negative for arthralgias.  All other systems reviewed and are negative.      Allergies  Review of patient's allergies indicates no known allergies.  Home Medications   Current Outpatient Rx  Name  Route  Sig  Dispense  Refill  . CALCIUM PO   Oral   Take 1 tablet by mouth daily.         Marland Kitchen  levothyroxine (SYNTHROID, LEVOTHROID) 175 MCG tablet      TAKE ONE TABLET BY MOUTH ONCE DAILY BEFORE BREAKFAST.   30 tablet   2     Patient need follow up for subsequent refill.    BP 142/97  Pulse 91  Temp(Src) 98.9 F (37.2 C) (Oral)  Resp 19  Wt 244 lb (110.678 kg)  SpO2 98%  LMP 06/23/2013 Physical Exam  Nursing note and vitals reviewed. Constitutional: She is oriented to person, place, and time. She appears well-developed and well-nourished. No distress.  HENT:  Head: Normocephalic and atraumatic.  Right Ear: External ear normal.  Left Ear: External ear normal.  Nose: Nose normal.  Mouth/Throat: Oropharynx is clear and moist. No oropharyngeal exudate.  Eyes: Conjunctivae are normal. Pupils are equal, round, and reactive to light. No scleral icterus.  Neck: Normal range of motion. Neck supple.  Cardiovascular: Normal rate, regular rhythm and normal heart sounds.  Exam reveals no gallop and no friction rub.   No murmur heard. Pulmonary/Chest: Effort normal and breath sounds normal. No respiratory distress. She has no wheezes. She has no rales. She exhibits no tenderness.  Abdominal: Soft. Bowel sounds are normal. She exhibits no distension. There is no tenderness. There is no rebound and no guarding.  Musculoskeletal: She exhibits  edema and tenderness.  R calf without erythema but noted with slight increase in edema over left, tender to palpation at posterior calf, no pitting edema,   Lymphadenopathy:    She has no cervical adenopathy.  Neurological: She is alert and oriented to person, place, and time. She exhibits normal muscle tone. Coordination normal.  Skin: Skin is warm and dry. No rash noted. No erythema. No pallor.  Psychiatric: She has a normal mood and affect. Her behavior is normal. Judgment and thought content normal.    ED Course  Procedures (including critical care time) Labs Review Labs Reviewed  CBC - Abnormal; Notable for the following:    WBC 10.8  (*)    All other components within normal limits  APTT  PROTIME-INR  COMPREHENSIVE METABOLIC PANEL  POCT PREGNANCY, URINE   Imaging Review No results found.  EKG Interpretation   None      Results for orders placed during the hospital encounter of 07/03/13  APTT      Result Value Ref Range   aPTT 29  24 - 37 seconds  CBC      Result Value Ref Range   WBC 10.8 (*) 4.0 - 10.5 K/uL   RBC 5.09  3.87 - 5.11 MIL/uL   Hemoglobin 13.3  12.0 - 15.0 g/dL   HCT 40.8  36.0 - 46.0 %   MCV 80.2  78.0 - 100.0 fL   MCH 26.1  26.0 - 34.0 pg   MCHC 32.6  30.0 - 36.0 g/dL   RDW 13.8  11.5 - 15.5 %   Platelets 254  150 - 400 K/uL  PROTIME-INR      Result Value Ref Range   Prothrombin Time 13.0  11.6 - 15.2 seconds   INR 1.00  0.00 - 1.49  COMPREHENSIVE METABOLIC PANEL      Result Value Ref Range   Sodium 137  137 - 147 mEq/L   Potassium 4.3  3.7 - 5.3 mEq/L   Chloride 99  96 - 112 mEq/L   CO2 23  19 - 32 mEq/L   Glucose, Bld 90  70 - 99 mg/dL   BUN 8  6 - 23 mg/dL   Creatinine, Ser 0.69  0.50 - 1.10 mg/dL   Calcium 9.2  8.4 - 10.5 mg/dL   Total Protein 8.0  6.0 - 8.3 g/dL   Albumin 3.9  3.5 - 5.2 g/dL   AST 23  0 - 37 U/L   ALT 26  0 - 35 U/L   Alkaline Phosphatase 56  39 - 117 U/L   Total Bilirubin 0.3  0.3 - 1.2 mg/dL   GFR calc non Af Amer >90  >90 mL/min   GFR calc Af Amer >90  >90 mL/min  POCT PREGNANCY, URINE      Result Value Ref Range   Preg Test, Ur NEGATIVE  NEGATIVE   Ct Angio Chest W/cm &/or Wo Cm  07/04/2013   CLINICAL DATA:  Calf swelling. DVT. Chest pain. Pulmonary embolism.  EXAM: CT ANGIOGRAPHY CHEST WITH CONTRAST  TECHNIQUE: Multidetector CT imaging of the chest was performed using the standard protocol during bolus administration of intravenous contrast. Multiplanar CT image reconstructions and MIPs were obtained to evaluate the vascular anatomy.  CONTRAST:  130m OMNIPAQUE IOHEXOL 350 MG/ML SOLN  COMPARISON:  None.  FINDINGS: Bilateral lower lobe pulmonary  emboli are present which appear acute. There is no right heart strain. Aorta and branch vessels appear within normal limits. Incidental imaging the  upper abdomen is normal. There is no pulmonary infarction or airspace disease. Tiny bilateral pleural effusions are present. Central airways are patent. Bones appear within normal limits. There is a tiny focus of airspace disease in the anterior left lower lobe is suspicious for pulmonary infarction.  Review of the MIP images confirms the above findings.  IMPRESSION: Acute bilateral lower lobe pulmonary emboli without right heart strain. Small focus of airspace disease in the anterior left lower lobe suspicious for pulmonary infarction. Critical Value/emergent results were called by telephone at the time of interpretation on 07/04/2013 at 1:10 AM to Dr. Ignacia Felling , who verbally acknowledged these results.   Electronically Signed   By: Dereck Ligas M.D.   On: 07/04/2013 01:10    Medications  Rivaroxaban (XARELTO) tablet 15 mg (not administered)  rivaroxaban (XARELTO) Education Kit for DVT/PE patients (not administered)  oxyCODONE-acetaminophen (PERCOCET/ROXICET) 5-325 MG per tablet 1 tablet (1 tablet Oral Given 07/04/13 0109)  iohexol (OMNIPAQUE) 350 MG/ML injection 100 mL (100 mLs Intravenous Contrast Given 07/04/13 0033)     MDM   RLE DVT Bilateral PE's  Patient here with known DVT from [facility] who also complains of mild chest pain and shortness of breath - she is hemodynamically stable and I have spoken with the [facility] resident who states that the patient does not need admission to the hospital for this.  She has no evidence of right sided heart strain, her blood pressure here is 140/90 and she is not tachycardic.  Because of this he reports that she can be seen tomorrow morning in their clinic.  The patient is aware of the plan and is in agreement.  Dr. Sharol Given, with whom I am working is also aware of this.  I will also send the patient home  on a short course of pain medication.  Pharmacy has discussed the medication with the patient and provided an education kit for the [place].  Patient has been given strict return precautions including worsening chest pain, shortness of breath, pain unrelieved with medication and bleeding precautions.   Idalia Needle Joelyn Oms, [place] 07/04/13 [phone]

## 2013-07-05 ENCOUNTER — Emergency Department (HOSPITAL_COMMUNITY): Payer: Medicaid Other

## 2013-07-05 ENCOUNTER — Encounter (HOSPITAL_COMMUNITY): Payer: Self-pay | Admitting: Emergency Medicine

## 2013-07-05 ENCOUNTER — Other Ambulatory Visit: Payer: Self-pay

## 2013-07-05 ENCOUNTER — Telehealth: Payer: Self-pay | Admitting: Family Medicine

## 2013-07-05 ENCOUNTER — Observation Stay (HOSPITAL_COMMUNITY)
Admission: EM | Admit: 2013-07-05 | Discharge: 2013-07-06 | Disposition: A | Discharge status: Home / Self Care | Payer: Medicaid Other | Attending: Family Medicine | Admitting: Family Medicine

## 2013-07-05 DIAGNOSIS — Z7901 Long term (current) use of anticoagulants: Secondary | ICD-10-CM | POA: Insufficient documentation

## 2013-07-05 DIAGNOSIS — F3289 Other specified depressive episodes: Secondary | ICD-10-CM | POA: Insufficient documentation

## 2013-07-05 DIAGNOSIS — E039 Hypothyroidism, unspecified: Secondary | ICD-10-CM | POA: Insufficient documentation

## 2013-07-05 DIAGNOSIS — I2699 Other pulmonary embolism without acute cor pulmonale: Principal | ICD-10-CM | POA: Diagnosis present

## 2013-07-05 DIAGNOSIS — Z6837 Body mass index (BMI) 37.0-37.9, adult: Secondary | ICD-10-CM | POA: Insufficient documentation

## 2013-07-05 DIAGNOSIS — G4700 Insomnia, unspecified: Secondary | ICD-10-CM | POA: Insufficient documentation

## 2013-07-05 DIAGNOSIS — I82409 Acute embolism and thrombosis of unspecified deep veins of unspecified lower extremity: Secondary | ICD-10-CM | POA: Insufficient documentation

## 2013-07-05 DIAGNOSIS — I824Y9 Acute embolism and thrombosis of unspecified deep veins of unspecified proximal lower extremity: Secondary | ICD-10-CM | POA: Diagnosis present

## 2013-07-05 DIAGNOSIS — Z87891 Personal history of nicotine dependence: Secondary | ICD-10-CM | POA: Insufficient documentation

## 2013-07-05 DIAGNOSIS — E669 Obesity, unspecified: Secondary | ICD-10-CM | POA: Insufficient documentation

## 2013-07-05 DIAGNOSIS — F329 Major depressive disorder, single episode, unspecified: Secondary | ICD-10-CM

## 2013-07-05 DIAGNOSIS — R079 Chest pain, unspecified: Secondary | ICD-10-CM

## 2013-07-05 LAB — BASIC METABOLIC PANEL
BUN: 5 mg/dL — ABNORMAL LOW (ref 6–23)
CO2: 26 mEq/L (ref 19–32)
Calcium: 9.4 mg/dL (ref 8.4–10.5)
Chloride: 99 mEq/L (ref 96–112)
Creatinine, Ser: 0.73 mg/dL (ref 0.50–1.10)
GFR calc Af Amer: >90 mL/min (ref >90)
GFR calc non Af Amer: >90 mL/min (ref >90)
Glucose, Bld: 89 mg/dL (ref 70–99)
Potassium: 4.3 mEq/L (ref 3.7–5.3)
Sodium: 137 mEq/L (ref 137–147)

## 2013-07-05 LAB — CBC
HCT: 37.8 % (ref 36.0–46.0)
Hemoglobin: 12.2 g/dL (ref 12.0–15.0)
MCH: 26 pg (ref 26.0–34.0)
MCHC: 32.3 g/dL (ref 30.0–36.0)
MCV: 80.4 fL (ref 78.0–100.0)
Platelets: 265 10*3/uL (ref 150–400)
RBC: 4.7 MIL/uL (ref 3.87–5.11)
RDW: 13.9 % (ref 11.5–15.5)
WBC: 13.3 10*3/uL — ABNORMAL HIGH (ref 4.0–10.5)

## 2013-07-05 LAB — TROPONIN I: Troponin I: <0.3 ng/mL (ref <0.30)

## 2013-07-05 MED ORDER — MORPHINE SULFATE 4 MG/ML IJ SOLN
6.0000 mg | Freq: Once | INTRAMUSCULAR | Status: AC
  Administered 2013-07-05: 6 mg via INTRAVENOUS
  Filled 2013-07-05: qty 2

## 2013-07-05 MED ORDER — ONDANSETRON HCL 4 MG/2ML IJ SOLN: 4.0000 mg | Freq: Three times a day (TID) | INTRAMUSCULAR | Status: DC | PRN

## 2013-07-05 MED ORDER — HYDROMORPHONE HCL PF 1 MG/ML IJ SOLN: 1.0000 mg | INTRAMUSCULAR | Status: DC | PRN

## 2013-07-05 MED ORDER — IOHEXOL 350 MG/ML SOLN
100.0000 mL | Freq: Once | INTRAVENOUS | Status: AC | PRN
  Administered 2013-07-05: 100 mL via INTRAVENOUS

## 2013-07-05 MED ORDER — DEXAMETHASONE SODIUM PHOSPHATE 10 MG/ML IJ SOLN: 10.0000 mg | Freq: Once | INTRAMUSCULAR | Status: DC

## 2013-07-05 NOTE — ED Notes (Signed)
Ambulated Pt 20 ft O2 stayed at 95-98

## 2013-07-05 NOTE — ED Notes (Signed)
[facility] called for transfer. Call made by Dairl Ponderscar Njihi RN.

## 2013-07-05 NOTE — ED Provider Notes (Signed)
CSN: [id number]     Arrival date & time 07/05/13  1201 History   First MD Initiated Contact with Patient 07/05/13 1323     Chief Complaint  Patient presents with  . Shortness of Breath     (Consider location/radiation/quality/duration/timing/severity/associated sxs/prior Treatment) HPI 43 yo white female with a PMH of recently diagnosed DVT and Pulmonary Embolus on Xarelto presents with SOB. Patient complains of pain in her left collar bone and trapezius area and left flank in the mid-axillary line when she takes breaths. This is preventing her from sleeping well at night. She describes this pain as sharp, 8/10, and nothing improves it. Additionally she endorses feeling weak with chills and a slight cough. Patient denies nausea, vomiting, diarrhea, fever, chest pain, abdominal pain, and headache.   Past Medical History  Diagnosis Date  . Depression   . Thyroid disease    History reviewed. No pertinent past surgical history. Family History  Problem Relation Age of Onset  . Hypertension Mother   . Heart disease Father 8   History  Substance Use Topics  . Smoking status: Former Games developer  . Smokeless tobacco: Former Neurosurgeon    Quit date: 12/23/2010     Comment: has quitt in 10-11 and re-started smoking again  . Alcohol Use: No   OB History   Grav Para Term Preterm Abortions TAB SAB Ect Mult Living                 Review of Systems  Constitutional: Positive for chills and fatigue. Negative for fever.  Eyes: Negative for visual disturbance.  Respiratory: Positive for cough and shortness of breath.   Cardiovascular: Negative for chest pain.  Gastrointestinal: Negative for nausea, vomiting, abdominal pain and diarrhea.  Neurological: Negative for headaches.      Allergies  Review of patient's allergies indicates no known allergies.  Home Medications   Current Outpatient Rx  Name  Route  Sig  Dispense  Refill  . Calcium-Vitamin D-Vitamin K (CALCIUM SOFT CHEWS PO)   Oral    Take 1-2 tablets by mouth daily.         Marland Kitchen levothyroxine (SYNTHROID, LEVOTHROID) 175 MCG tablet   Oral   Take 175 mcg by mouth daily.         Marland Kitchen oxyCODONE-acetaminophen (PERCOCET/ROXICET) 5-325 MG per tablet   Oral   Take 1 tablet by mouth every 4 (four) hours as needed for severe pain.   30 tablet   0   . XARELTO STARTER PACK 15 & 20 MG TBPK   Oral   Take 15-20 mg by mouth as directed. Take as directed on package: Start with one 15mg  tablet by mouth twice a day with food. On Day 22, switch to one 20mg  tablet once a day with food.   51 each   0     Dispense as written.    BP 151/85  Pulse 83  Temp(Src) 98.2 F (36.8 C) (Oral)  Resp 18  SpO2 97%  LMP 06/23/2013 Physical Exam  Constitutional: She appears well-developed and well-nourished.  HENT:  Head: Normocephalic and atraumatic.  Mouth/Throat: Oropharynx is clear and moist. No oropharyngeal exudate.  Neck: Normal range of motion. Neck supple.  Cardiovascular: Normal rate, regular rhythm and normal heart sounds.  Exam reveals no gallop and no friction rub.   No murmur heard. Pulmonary/Chest: Effort normal. No respiratory distress. She has no wheezes. She has rales. She exhibits tenderness.  Abdominal: Soft. Bowel sounds are normal. She exhibits no mass.  There is no tenderness. There is no rebound and no guarding.  Psychiatric: She is slowed. She exhibits a depressed mood.    ED Course  Procedures (including critical care time) Labs Review Labs Reviewed - No data to display Imaging Review Dg Chest 2 View  07/05/2013   CLINICAL DATA:  Increased shortness of breath and left upper anterior chest pain. Recent pulmonary emboli.  EXAM: CHEST  2 VIEW  COMPARISON:  CT scan of the chest dated 07/04/2013  FINDINGS: There is new slight atelectasis at the left lung base. Lungs are otherwise clear. Heart size and vascularity appear normal. No osseous abnormality. No effusions.  IMPRESSION: New slight left base atelectasis.    Electronically Signed   By: Geanie CooleyJim  Maxwell M.D.   On: 07/05/2013 13:09   Ct Angio Chest W/cm &/or Wo Cm  07/04/2013   CLINICAL DATA:  Calf swelling. DVT. Chest pain. Pulmonary embolism.  EXAM: CT ANGIOGRAPHY CHEST WITH CONTRAST  TECHNIQUE: Multidetector CT imaging of the chest was performed using the standard protocol during bolus administration of intravenous contrast. Multiplanar CT image reconstructions and MIPs were obtained to evaluate the vascular anatomy.  CONTRAST:  100mL OMNIPAQUE IOHEXOL 350 MG/ML SOLN  COMPARISON:  None.  FINDINGS: Bilateral lower lobe pulmonary emboli are present which appear acute. There is no right heart strain. Aorta and branch vessels appear within normal limits. Incidental imaging the upper abdomen is normal. There is no pulmonary infarction or airspace disease. Tiny bilateral pleural effusions are present. Central airways are patent. Bones appear within normal limits. There is a tiny focus of airspace disease in the anterior left lower lobe is suspicious for pulmonary infarction.  Review of the MIP images confirms the above findings.  IMPRESSION: Acute bilateral lower lobe pulmonary emboli without right heart strain. Small focus of airspace disease in the anterior left lower lobe suspicious for pulmonary infarction. Critical Value/emergent results were called by telephone at the time of interpretation on 07/04/2013 at 1:10 AM to Dr. Cherrie DistanceFRANCES SANFORD , who verbally acknowledged these results.   Electronically Signed   By: Andreas NewportGeoffrey  Lamke M.D.   On: 07/04/2013 01:10    EKG Interpretation   None      Patient will possibly be admitted for further care and eval. Told the plan and patient agrees with our course of treatment   Carlyle DollyChristopher W Rudie Sermons, PA-C 07/08/13 [phone]

## 2013-07-05 NOTE — Telephone Encounter (Addendum)
Inactivity could be a potential cause of DVT/PE,but if you have DVT with pain, it might be worthwhile resting. If her pain persist or worsen or having SOB have her follow up with us sooner or go to the ED.

## 2013-07-05 NOTE — Telephone Encounter (Signed)
LM for pt to call back.  Please give message when she does thanks. Jazmin Hartsell,CMA

## 2013-07-05 NOTE — Telephone Encounter (Signed)
Pt is not feeling any better. It is hurting worse when she breathes. She has been told conflicting things---rest and keep legs elevated AND move around. Please advise

## 2013-07-05 NOTE — ED Provider Notes (Signed)
Pt with newly diagnosed bilateral PE currently on Xalrelto here with pleuritic CP/SOB.  She will be reimaging with CT to assess her PE.  If no acute changes and pt tolerates pain, may increase her pain meds, if pt cant tolerates then [facility].    8:20 PM Repeat chest CTA shows no change in burn of the bilateral PE. No evidence of right heart strain. There is worsened bilateral airspace process in the lower lobes and lingula likely infarction/atelectasis. There is a new small left effusion.  Pt request to be admitted for further management of her condition.  She especially concerns due to having family members that died prematurely due to cardiac disease and "blood clots".    The patient does have a significant family history of cardiac disease and PE. Otherwise has no other risk factors. At this time patient continues to endorse shortness of breath and pleuritic chest pain. On exam patient is tachycardic without any murmurs, rubs or gallops, lungs essentially clear to auscultation with mild rales heard to be right lower lobe. Abdomen is soft and nontender. Bilateral lower extremities with tenderness to right calf, positive Homans sign, intact distal pulses and no obvious edema noted.  8:36 PM [facility] who agrees to admit pt under attending Dr. Donnella ShamKyle Fletke [phone]).  Care discussed with Dr. Freida BusmanAllen.  BP 145/93  Pulse 94  Temp(Src) 98.2 F (36.8 C) (Oral)  Resp 18  SpO2 95%  LMP 06/23/2013  I have reviewed nursing notes and vital signs. I personally reviewed the imaging tests through PACS system  I reviewed available ER/hospitalization records thought the EMR]  Results for orders placed during the hospital encounter of 07/05/13  CBC      Result Value Ref Range   WBC 13.3 (*) 4.0 - 10.5 K/uL   RBC 4.70  3.87 - 5.11 MIL/uL   Hemoglobin 12.2  12.0 - 15.0 g/dL   HCT 45.437.8  09.836.0 - 11.946.0 %   MCV 80.4  78.0 - 100.0 fL   MCH 26.0  26.0 - 34.0 pg   MCHC  32.3  30.0 - 36.0 g/dL   RDW 14.713.9  82.911.5 - 56.215.5 %   Platelets 265  150 - 400 K/uL  BASIC METABOLIC PANEL      Result Value Ref Range   Sodium 137  137 - 147 mEq/L   Potassium 4.3  3.7 - 5.3 mEq/L   Chloride 99  96 - 112 mEq/L   CO2 26  19 - 32 mEq/L   Glucose, Bld 89  70 - 99 mg/dL   BUN 5 (*) 6 - 23 mg/dL   Creatinine, Ser 1.300.73  0.50 - 1.10 mg/dL   Calcium 9.4  8.4 - 86.510.5 mg/dL   GFR calc non Af Amer >90  >90 mL/min   GFR calc Af Amer >90  >90 mL/min  TROPONIN I      Result Value Ref Range   Troponin I <0.30  <0.30 ng/mL   Dg Chest 2 View  07/05/2013   CLINICAL DATA:  Increased shortness of breath and left upper anterior chest pain. Recent pulmonary emboli.  EXAM: CHEST  2 VIEW  COMPARISON:  CT scan of the chest dated 07/04/2013  FINDINGS: There is new slight atelectasis at the left lung base. Lungs are otherwise clear. Heart size and vascularity appear normal. No osseous abnormality. No effusions.  IMPRESSION: New slight left base atelectasis.   Electronically Signed   By: Geanie CooleyJim  Maxwell  M.D.   On: 07/05/2013 13:09   Ct Angio Chest Pe W/cm &/or Wo Cm  07/05/2013   CLINICAL DATA:  Left-sided chest pain. Re-evaluate pulmonary embolism.  EXAM: CT ANGIOGRAPHY CHEST WITH CONTRAST  TECHNIQUE: Multidetector CT imaging of the chest was performed using the standard protocol during bolus administration of intravenous contrast. Multiplanar CT image reconstructions and MIPs were obtained to evaluate the vascular anatomy.  CONTRAST:  OMNIPAQUE IOHEXOL 350 MG/ML SOLN  COMPARISON:  07/04/2013  FINDINGS: Lungs are adequately inflated and demonstrate worsening opacification over the lower lobes and lingula most prominent over the left lower lobe. There is a small new left pleural effusion. Heart is normal in size. There is evidence of patient's known bilateral small pulmonary emboli most prominent over the proximal lower lobar pulmonary arteries. Patient's emboli burden is unchanged overall. Findings are  not significantly changed. RV/LV ratio is 0.86. Remaining mediastinal structures are unremarkable.  Images through the upper abdomen are unchanged. Remainder of the exam is unchanged.  Review of the MIP images confirms the above findings.  IMPRESSION: No change in the burden of bilateral pulmonary emboli predominately involving the proximal lower lobar pulmonary arteries. No evidence of right heart strain. Worsening bilateral airspace process in the lower lobes and lingula likely infarction/atelectasis. New small left effusion.   Electronically Signed   By: Elberta Fortis M.D.   On: 07/05/2013 19:13   Ct Angio Chest W/cm &/or Wo Cm  07/04/2013   CLINICAL DATA:  Calf swelling. DVT. Chest pain. Pulmonary embolism.  EXAM: CT ANGIOGRAPHY CHEST WITH CONTRAST  TECHNIQUE: Multidetector CT imaging of the chest was performed using the standard protocol during bolus administration of intravenous contrast. Multiplanar CT image reconstructions and MIPs were obtained to evaluate the vascular anatomy.  CONTRAST:  OMNIPAQUE IOHEXOL 350 MG/ML SOLN  COMPARISON:  None.  FINDINGS: Bilateral lower lobe pulmonary emboli are present which appear acute. There is no right heart strain. Aorta and branch vessels appear within normal limits. Incidental imaging the upper abdomen is normal. There is no pulmonary infarction or airspace disease. Tiny bilateral pleural effusions are present. Central airways are patent. Bones appear within normal limits. There is a tiny focus of airspace disease in the anterior left lower lobe is suspicious for pulmonary infarction.  Review of the MIP images confirms the above findings.  IMPRESSION: Acute bilateral lower lobe pulmonary emboli without right heart strain. Small focus of airspace disease in the anterior left lower lobe suspicious for pulmonary infarction. Critical Value/emergent results were called by telephone at the time of interpretation on 07/04/2013 at 1:10 AM to Dr. Cherrie Distance , who  verbally acknowledged these results.   Electronically Signed   By: Andreas Newport M.D.   On: 07/04/2013 01:10      Fayrene Helper, PA-C 07/05/13 2138

## 2013-07-05 NOTE — ED Notes (Signed)
[facility] and dx with rt lower leg and pulmonary embolus.  Pt placed on xarelto.  Pt states did not have chest pain at that time and now having intermittent chest pain with some shortness of breath.

## 2013-07-06 ENCOUNTER — Other Ambulatory Visit: Payer: Self-pay

## 2013-07-06 DIAGNOSIS — R079 Chest pain, unspecified: Secondary | ICD-10-CM

## 2013-07-06 DIAGNOSIS — I824Y9 Acute embolism and thrombosis of unspecified deep veins of unspecified proximal lower extremity: Secondary | ICD-10-CM

## 2013-07-06 DIAGNOSIS — E039 Hypothyroidism, unspecified: Secondary | ICD-10-CM

## 2013-07-06 DIAGNOSIS — F3289 Other specified depressive episodes: Secondary | ICD-10-CM

## 2013-07-06 DIAGNOSIS — I2699 Other pulmonary embolism without acute cor pulmonale: Secondary | ICD-10-CM

## 2013-07-06 DIAGNOSIS — F329 Major depressive disorder, single episode, unspecified: Secondary | ICD-10-CM

## 2013-07-06 LAB — BASIC METABOLIC PANEL
BUN: 5 mg/dL — ABNORMAL LOW (ref 6–23)
CALCIUM: 8.8 mg/dL (ref 8.4–10.5)
CO2: 25 meq/L (ref 19–32)
Chloride: 100 mEq/L (ref 96–112)
Creatinine, Ser: 0.63 mg/dL (ref 0.50–1.10)
GFR calc Af Amer: >90 mL/min (ref >90)
GLUCOSE: 116 mg/dL — AB (ref 70–99)
Potassium: 3.9 mEq/L (ref 3.7–5.3)
Sodium: 137 mEq/L (ref 137–147)

## 2013-07-06 LAB — CBC
HCT: 36.1 % (ref 36.0–46.0)
HEMOGLOBIN: 11.9 g/dL — AB (ref 12.0–15.0)
MCH: 26.6 pg (ref 26.0–34.0)
MCHC: 33 g/dL (ref 30.0–36.0)
MCV: 80.6 fL (ref 78.0–100.0)
Platelets: 291 10*3/uL (ref 150–400)
RBC: 4.48 MIL/uL (ref 3.87–5.11)
RDW: 14.2 % (ref 11.5–15.5)
WBC: 11.9 10*3/uL — ABNORMAL HIGH (ref 4.0–10.5)

## 2013-07-06 LAB — PROTIME-INR
INR: 1.27 (ref 0.00–1.49)
Prothrombin Time: 15.6 seconds — ABNORMAL HIGH (ref 11.6–15.2)

## 2013-07-06 MED ORDER — LEVOTHYROXINE SODIUM 175 MCG PO TABS
175.0000 ug | ORAL_TABLET | Freq: Every day | ORAL | Status: DC
  Administered 2013-07-06: 175 ug via ORAL
  Filled 2013-07-06 (×2): qty 1

## 2013-07-06 MED ORDER — DOCUSATE SODIUM 100 MG PO CAPS: 100.0000 mg | ORAL_CAPSULE | Freq: Two times a day (BID) | ORAL | Status: DC | PRN

## 2013-07-06 MED ORDER — HYDROMORPHONE HCL PF 1 MG/ML IJ SOLN
1.0000 mg | INTRAMUSCULAR | Status: DC | PRN
  Administered 2013-07-06: 1 mg via INTRAVENOUS
  Filled 2013-07-06: qty 1

## 2013-07-06 MED ORDER — ONDANSETRON HCL 4 MG/2ML IJ SOLN: 4.0000 mg | Freq: Three times a day (TID) | INTRAMUSCULAR | Status: DC | PRN

## 2013-07-06 MED ORDER — OXYCODONE-ACETAMINOPHEN 5-325 MG PO TABS
1.0000 | ORAL_TABLET | ORAL | Status: DC | PRN
  Administered 2013-07-06 (×2): 2 via ORAL
  Filled 2013-07-06 (×2): qty 2

## 2013-07-06 MED ORDER — RIVAROXABAN 15 MG PO TABS
15.0000 mg | ORAL_TABLET | Freq: Two times a day (BID) | ORAL | Status: DC
  Administered 2013-07-06: 15 mg via ORAL
  Filled 2013-07-06 (×2): qty 1

## 2013-07-06 MED ORDER — HYDROCODONE-ACETAMINOPHEN 5-325 MG PO TABS: 1.0000 | ORAL_TABLET | ORAL | Status: DC | PRN

## 2013-07-06 MED ORDER — SODIUM CHLORIDE 0.9 % IJ SOLN
3.0000 mL | Freq: Two times a day (BID) | INTRAMUSCULAR | Status: DC
  Administered 2013-07-06: 3 mL via INTRAVENOUS

## 2013-07-06 NOTE — H&P (Signed)
Family Medicine Teaching [facility] Admission History and Physical Service Pager: [phone]  Patient name: Diane NorfolkKeri L Fitting Medical record number: [id number] Date of birth: 12/31/1970 Age: 43 y.o. Gender: female  Primary Care Provider: Janit PaganENIOLA, KEHINDE, MD Consultants: none Code Status: Full  Chief Complaint: SOB, chest pain  Assessment and Plan: Diane NorfolkKeri L Dhawan is a 43 y.o. female presenting with SOB and CP secondary to recently-diagnosed PE and right leg DVT. PMH is significant for depression, hypothyroidism.   # PE and right leg DVT: on day 2 of xarelto BID dosing. VSS, not requiring oxygen at this time sat 92-96%. [facility] shows stable bilateral lower lobe PE, no evidence of right heart strain, likely worsened infarction/atelectasis in lower lobe and lingula, new small left effusion. Labs: bmet wnl, CBC with slight WBC bump from 10.8 to 13.3, neg troponin x1. EKG normal sinus rhythm. - admit to tele - pain control = dilaudid 1mg  q4hr prn - supplemental O2 to keep sat >92% - incentive spirometry - pulse ox with vitals - AM labs = BMP, CBC, INR - repeat EKG in morning - consider CXR in AM for effusion - if repeat CXR is obtained and shows infiltrate, or if WBC increasing, pt has worse SOB, fever spikes, or if she has new O2 requirement develop, would favor treatment for CAP with Rocephin  #Hypothyroidism - continue home Synthroid  #Depression - not on any home meds, mood stable; monitor clinically  FEN/GI: saline lock, heart diet Prophylaxis: on xarelto  Disposition: admit to tele  History of Present Illness: Diane [name] is a 43 y.o. female presenting with worsened SOB and chest pain after being diagnosed with DVT/PE 2 days ago. First noticed that her right leg was painful on Sunday, thought it was from using the treadmill the day before. Had worsened swelling that prompted her to go to urgent care on 2/11 where ultrasound showed DVT, sent to the ED where CT  showed bilateral lower lobe PE. She was started on xarelto and discharged, followed up in clinic on 2/12, she reports being compliant with her xarelto. She says she was not able to get much sleep due to her breathing. SOB and pain worsened that day and [facility] repeated the CT which showed persistent clots but no increased clot burden. She was given 6mg  morphine x 2 with some relief of pain. On arrival here she says that her symptoms have improved a little, still has continued pain in chest and some SOB. She says that she can't breathe while laying down. She denies any trauma to the leg, prolonged sitting or long travel.   She denies fevers, chills, hematemesis (does endorse some minor nonproductive cough), nausea/vomiting.  Review Of Systems: Per HPI with the following additions: none Otherwise 12 point review of systems was performed and was unremarkable.  Patient Active Problem List   Diagnosis Date Noted  . Pulmonary embolism 07/05/2013  . DVT, lower extremity, proximal, acute 07/04/2013  . Acute pulmonary embolus 07/04/2013  . Tobacco abuse 04/06/2012  . Insomnia 05/25/2011  . HYPOTHYROIDISM, UNSPECIFIED 07/20/2006  . OBESITY, NOS 07/20/2006  . DEPRESSIVE DISORDER, NOS 07/20/2006   Past Medical History: Past Medical History  Diagnosis Date  . Depression   . Thyroid disease    Past Surgical History: History reviewed. No pertinent past surgical history. Social History: History  Substance Use Topics  . Smoking status: Former Games developermoker  . Smokeless tobacco: Former NeurosurgeonUser  Quit date: 12/23/2010     Comment: has quitt in 10-11 and re-started smoking again  . Alcohol Use: No   Additional social history: Quit smoking about 1 year ago Please also refer to relevant sections of EMR.  Family History: Family History  Problem Relation Age of Onset  . Hypertension Mother   . Heart disease Father 67  Reported clots in father, grandfather, uncle; patient is unsure the  circumstances of them but believes leg clots and no PE  Allergies and Medications: No Known Allergies No current facility-administered medications on file prior to encounter.   Current Outpatient Prescriptions on File Prior to Encounter  Medication Sig Dispense Refill  . oxyCODONE-acetaminophen (PERCOCET/ROXICET) 5-325 MG per tablet Take 1 tablet by mouth every 4 (four) hours as needed for severe pain.  30 tablet  0  . XARELTO STARTER PACK 15 & 20 MG TBPK Take 15-20 mg by mouth as directed. Take as directed on package: Start with one 15mg  tablet by mouth twice a day with food. On Day 22, switch to one 20mg  tablet once a day with food.  51 each  0    Objective: BP 139/86  Pulse 106  Temp(Src) 98.2 F (36.8 C) (Oral)  Resp 18  Ht 5\' 8"  (1.727 m)  Wt 246 lb 0.5 oz (111.6 kg)  BMI 37.42 kg/m2  SpO2 96%  LMP 06/23/2013 Exam: General: NAD, sitting with several pillows propping her upright; SOB slightly worse with change in position HEENT: PERRL, EOMI, MMM. Cardiovascular: Mild tachycardia, normal s1/s2, no murmurs Respiratory: CTAB, normal effort, diminished sounds at bases Abdomen: soft, obese, nontender, nondistended Extremities: trace edema bilaterally. Right calf obviously swollen, measures 46.5cm, left calf 45cm. Tender to palpation over anterior aspect of right lower leg. No palpable cords. Skin: warm and dry Neuro: awake, alert, oriented. No focal deficits  Labs and Imaging: CBC BMET   Recent Labs Lab 07/05/13 1514  WBC 13.3*  HGB 12.2  HCT 37.8  PLT 265    Recent Labs Lab 07/05/13 1514  NA 137  K 4.3  CL 99  CO2 26  BUN 5*  CREATININE 0.73  GLUCOSE 89  CALCIUM 9.4     CT angio chest PE protocol: IMPRESSION:  No change in the burden of bilateral pulmonary emboli predominately  involving the proximal lower lobar pulmonary arteries. No evidence  of right heart strain. Worsening bilateral airspace process in the  lower lobes and lingula likely  infarction/atelectasis. New small  left effusion.  Tawni Carnes, MD 07/06/2013, 1:49 AM PGY-1, [place] [facility] FPTS Intern pager: [phone], text pages welcome  FPTS Upper-Level Resident Addendum I have independently interviewed and examined the patient. I have discussed the above with Dr. Waynetta Sandy and agree with his documentation as above. The above reflects his original note with my edits for correction/additions/clarification in orange. Please see also any attending notes.   Bobbye Morton, MD PGY-2, [facility] FPTS Service pager: [phone] (text pages welcome through [facility])

## 2013-07-06 NOTE — Discharge Summary (Signed)
I agree with the discharge summary as documented.   Damian Hofstra MD  

## 2013-07-06 NOTE — H&P (Signed)
FMTS Attending Note  I personally saw and evaluated the patient. The plan of care was discussed with the resident team. I agree with the assessment and plan as documented by the resident.   43 year old female recently diagnosed with DVT of the right lower extremity and bilaterally pulmonary embolism admitted for uncontrolled pleuritic chest pain and shortness of breath related to pulmonary embolism. Patient has been taking several to 50 mg twice a day a home. Patient states that her shortness of breath is improved this morning, she does continue to have pleuritic chest pain, please refer to resident note for further history of present illness, patient reports that her father had a blood clot in his 7050s, she is uncertain if this was provoked or genetic in nature, patient denies a personal history of blood clots, she denies recent travel or surgery, she is not currently on oral contraceptive pills.  Vitals: Reviewed General: Pleasant Caucasian female, no acute distress Cardiac: Regular rate and rhythm, S1 and S2 present, no murmurs, no heaves or thrills Respiratory: Mild decreased breath sounds the left lung bases, normal effort Abdomen: Soft, nontender, bowel sounds present Extremities: Mild right calf pain, no palpable cords, trace bilateral pedal edema present Skin: No rash  Reviewed imaging and lab work  Assessment and plan: 43 year old female recently diagnosed with DVT of the right lower extremity in bilateral pulmonary embolism admitted with increasing shortness of breath and pleuritic chest pain 1. Pulmonary emboli-no increasing clot burden per CT scan, continue Xarelto, patient is maintaining oxygen saturation on room air however is wearing oxygen for comfort, patient had a bili today and use incentive spirometry to help improve aeration, patient likely will be stable for discharge later today, patient will need further workup for cause of pulmonary emboli after she has completed at least 6  month course of anticoagulation 2. Other medical conditions stable  Donnella ShamKyle Rudransh Bellanca M.D.

## 2013-07-06 NOTE — Progress Notes (Signed)
UR Completed.  Kamaiyah Uselton Jane [phone] 07/06/2013  

## 2013-07-06 NOTE — Discharge Instructions (Signed)
Your chest CT did not show any worse clots in your lungs. It will take some time for your body to remove the clots on its own (weeks to months). You'll need to stay on Xarelto at least for the next few months. We will need to check some blood work that may take a while to come back to see if we can find a reason for your blood clots -- some of this can be done in the next month or so, but some will need to wait until after you're off of any blood thinning medicine.  Continue to take the Percocet that you were previously prescribed for your pain as needed.  You should follow up with Dr. Lum BabeEniola within 1 week. She can talk to you more about checking blood tests to look for clotting problems. If your symptoms get worse instead of better, or if you have new symptoms, follow up sooner or come back to the emergency room.  Pulmonary Embolus A pulmonary (lung) embolus (PE) is a blood clot that has traveled from another place in the body to the lung. Most clots come from deep veins in the legs or pelvis. PE is a dangerous and potentially life-threatening condition that can be treated if identified. CAUSES Blood clots form in a vein for different reasons. Usually several things cause blood clots. They include:  The flow of blood slows down.  The inside of the vein is damaged in some way.  The person has a condition that makes the blood clot more easily. These conditions may include:  Older age (especially over 42 years old).  Having a history of blood clots.  Having major or lengthy surgery. Hip surgery is particularly high-risk.  Breaking a hip or leg.  Sitting or lying still for a long time.  Cancer or cancer treatment.  Having a long, thin tube (catheter) placed inside a vein during a medical procedure.  Being overweight (obese).  Pregnancy and childbirth.  Medicines with estrogen.  Smoking.  Other circulation or heart problems. SYMPTOMS  The symptoms of a PE usually start suddenly  and include:  Shortness of breath.  Coughing.  Coughing up blood or blood-tinged mucus (phlegm).  Chest pain. Pain is often worse with deep breaths.  Rapid heartbeat. DIAGNOSIS  If a PE is suspected, your caregiver will take a medical history and carry out a physical exam. Your caregiver will check for the risk factors listed above. Tests that also may be required include:  Blood tests, including studies of the clotting properties of your blood.  Imaging tests. Ultrasound, CT, MRI, and other tests can all be used to see if you have clots in your legs or lungs. If you have a clot in your legs and have breathing or chest problems, your caregiver may conclude that you have a clot in your lungs. Further lung tests may not be needed.  Electrocardiography can look for heart strain from blood clots in the lungs. PREVENTION   Exercise the legs regularly. Take a brisk 30 minute walk every day.  Maintain a weight that is appropriate for your height.  Avoid sitting or lying in bed for long periods of time without moving your legs.  Women, particularly those over the age of 10935, should consider the risks and benefits of taking estrogen medicines, including birth control pills.  Do not smoke, especially if you take estrogen medicines.  Long-distance travel can increase your risk. You should exercise your legs by walking or pumping the muscles every  hour.  In hospital prevention:  Your caregiver will assess your need for preventive PE care (prophylaxis) when you are admitted to the hospital. If you are having surgery, your surgeon will assess you the day of or day after surgery.  Prevention may include medical and nonmedical measures. TREATMENT   The most common treatment for a PE is blood thinning (anticoagulant) medicine, which reduces the blood's tendency to clot. Anticoagulants can stop new blood clots from forming and old ones from growing. They cannot dissolve existing clots. Your body  does this by itself over time. Anticoagulants can be given by mouth, by intravenous (IV) access, or by injection. Your caregiver will determine the best program for you.  Less commonly, clot-dissolving drugs (thrombolytics) are used to dissolve a PE. They carry a high risk of bleeding, so they are used mainly in severe cases.  Very rarely, a blood clot in the leg needs to be removed surgically.  If you are unable to take anticoagulants, your caregiver may arrange for you to have a filter placed in a main vein in your abdomen. This filter prevents clots from traveling to your lungs. HOME CARE INSTRUCTIONS   Take all medicines prescribed by your caregiver. Follow the directions carefully.  Warfarin. Most people will continue taking warfarin after hospital discharge. Your caregiver will advise you on the length of treatment (usually 3 6 months, sometimes lifelong).  Too much and too little warfarin are both dangerous. Too much warfarin increases the risk of bleeding. Too little warfarin continues to allow the risk for blood clots. While taking warfarin, you will need to have regular blood tests to measure your blood clotting time. These blood tests usually include both the prothrombin time (PT) and International Normalized Ratio (INR) tests. The PT and INR results allow your caregiver to adjust your dose of warfarin. The dose can change for many reasons. It is critically important that you take warfarin exactly as prescribed, and that you have your PT and INR levels drawn exactly as directed.  Many foods, especially foods high in vitamin K can interfere with warfarin and affect the PT and INR results. Foods high in vitamin K include spinach, kale, broccoli, cabbage, collard and turnip greens, brussels sprouts, peas, cauliflower, seaweed, and parsley as well as beef and pork liver, green tea, and soybean oil. You should eat a consistent amount of foods high in vitamin K. Avoid major changes in your diet,  or notify your caregiver before changing your diet. Arrange a visit with a dietitian to answer your questions.  Many medicines can interfere with warfarin and affect the PT and INR results. You must tell your caregiver about any and all medicines you take, this includes all vitamins and supplements. Be especially cautious with aspirin and anti-inflammatory medicines. Ask your caregiver before taking these. Do not take or discontinue any prescribed or over-the-counter medicine except on the advice of your caregiver or pharmacist.  Warfarin can have side effects, such as excessive bruising or bleeding. You will need to hold pressure over cuts for longer than usual.  Alcohol can change the body's ability to handle warfarin. It is best to avoid alcoholic drinks or consume only very small amounts while taking warfarin. Notify your caregiver if you change your alcohol intake.  Notify your dentist or other caregivers before procedures.  Avoid contact sports.  Wear a medical alert bracelet or carry a medical alert card.  Ask your caregiver how soon you can go back to normal activities. Not being  active can lead to new clots. Ask for a list of what you should and should not do.  Compression stockings. These are tight elastic stockings that apply pressure to the lower legs. This can help keep the blood in the legs from clotting. You may need to wear compressions stockings at home to help prevent clots.  Smoking. If you smoke, quit. Ask your caregiver for help with quitting smoking.  Learn as much as you can about PE. Educating yourself can help prevent PE from reoccurring. SEEK MEDICAL CARE IF:   You notice a rapid heartbeat.  You feel weaker or more tired than usual.  You feel faint.  You notice increased bruising.  Your symptoms are not getting better in the time expected.  You are having side effects of medicine. SEEK IMMEDIATE MEDICAL CARE IF:   You have chest pain.  You have trouble  breathing.  You have new or increased swelling or pain in one leg.  You cough up blood.  You notice blood in vomit, in a bowel movement, or in urine.  You have an oral temperature above 102 F (38.9 C), not controlled by medicine. You may have another PE. A blood clot in the lungs is a medical emergency. Call your local emergency services (911 in [place] to get to the nearest hospital or clinic. Do not drive yourself. MAKE SURE YOU:   Understand these instructions.  Will watch your condition.  Will get help right away if you are not doing well or get worse. Document Released: 05/06/2000 Document Revised: 11/08/2011 Document Reviewed: 11/10/2008 [facility] Patient Information 2014 [place], [place].

## 2013-07-06 NOTE — Progress Notes (Signed)
Discharged to home with family office visits in place teaching done  

## 2013-07-06 NOTE — Discharge Summary (Signed)
Family Medicine Teaching [facility] Discharge Summary  Patient name: Diane [name] Medical record number: [id number] Date of birth: May 01, 1971 Age: 43 y.o. Gender: female Date of Admission: 07/05/2013  Date of Discharge: 07/06/13 Admitting Physician: Uvaldo Rising, MD  Primary Care Provider: Janit Pagan, MD Consultants: none  Indication for Hospitalization: chest pain, SOB in setting of recently-diagnosed PE  Discharge Diagnoses/Problem List:  Bilateral PE Right LE DVT, unprovoked, first episode Hypothyroidism Depression NOS  Disposition: discharge home  Discharge Condition: stable  Discharge Exam: General: sitting up in bed, pleasant and conversational, NAD  Cardiac: RRR, S1/S2 present, no murmurs Respiratory: Mild decreased breath sounds the left lung bases. Normal WOB, no tachypnea. Abdomen: Soft, NTND,+active BS Extremities: Persistent mild right calf pain, w/o palpable cords, trace bilateral pedal edema present  Skin: warm, dry, no rash  Brief Hospital Course: Diane [name] is a 43 y.o. female who presented to [facility] ED with SOB and CP secondary to recently-diagnosed PE and right leg DVT. PMH is significant for depression, hypothyroidism. Pt was placed in observation with concern for worsening symptoms though repeat CTA did not show increased clot burden and pt's symptoms had started to improve slightly once arriving to [facility] from [place]. Overnight, pt had mild improvement of her symptoms without increased O2 req. Continued to improve and prior to discharge demonstrated normal O2 sat on RA with ambulation (>92%), pain improved by Percocet, mild persistent tachycardia otherwise VSS. Please see below for details by problem list.  # PE and right leg DVT: on day 2 of xarelto BID dosing at time of admission, with VSS, not requiring oxygen, sats 92-96%. Repeat CT at [facility] ED shows stable bilateral lower lobe PE, no evidence of right heart strain, likely worsened  infarction/atelectasis in lower lobe and lingula, new small left effusion. Admit labs were remarkable only for CBC with slight WBC bump from 10.8 to 13.3 (dec to 11.9). EKG in the ED showed normal sinus rhythm with repeat normal. Pain was initially controlled with morphine, switched to Percocet PO prior to discharge, (currently has home rx, counted with >20 pills left). There was no acute indication for abx nor concern for pneumonia.  #Hypothyroidism - continued home Synthroid   #Depression - not on any home meds, mood stable; monitored clinically  Issues for Follow Up:  1. Pain / SOB / PE - ensure improvement in symptoms and consider anticoagulation work-up as an outpt following completed Xarelto treatment, suspect possible genetic hypercoagulability (given +family h/o multiple DVTs), given current DVT unprovoked. 2. Continue Xarelto - currently on BID dosing, recommend total 6 month treatment  Significant Procedures: none  Significant Labs and Imaging:   Recent Labs Lab 07/03/13 1723 07/05/13 1514 07/06/13 0900  WBC 10.8* 13.3* 11.9*  HGB 13.3 12.2 11.9*  HCT 40.8 37.8 36.1  PLT 254 265 291    Recent Labs Lab 07/03/13 1723 07/05/13 1514 07/06/13 0900  NA 137 137 137  K 4.3 4.3 3.9  CL 99 99 100  CO2 23 26 25   GLUCOSE 90 89 116*  BUN 8 5* 5*  CREATININE 0.69 0.73 0.63  CALCIUM 9.2 9.4 8.8  ALKPHOS 56  --   --   AST 23  --   --   ALT 26  --   --   ALBUMIN 3.9  --   --    INR 1.27 / PT 15.6  Troponin - Negative x 1  EKG x2, 2/13 - 2/14 Sinus tachycardia  CXR, 2/13 New  left basilar atelectasis, otherwise no infiltrate  CTA, chest 2/13: IMPRESSION:  No change in the burden of bilateral pulmonary emboli predominately  involving the proximal lower lobar pulmonary arteries. No evidence  of right heart strain. Worsening bilateral airspace process in the  lower lobes and lingula likely infarction/atelectasis. New small  left effusion.   Results/Tests Pending at  Time of Discharge: none  Discharge Medications:    Medication List         CALCIUM SOFT CHEWS PO  Take 1-2 tablets by mouth daily.     levothyroxine 175 MCG tablet  Commonly known as:  SYNTHROID, LEVOTHROID  Take 175 mcg by mouth daily.     oxyCODONE-acetaminophen 5-325 MG per tablet  Commonly known as:  PERCOCET/ROXICET  Take 1 tablet by mouth every 4 (four) hours as needed for severe pain.     XARELTO STARTER PACK 15 & 20 MG Tbpk  Generic drug:  Rivaroxaban  Take 15-20 mg by mouth as directed. Take as directed on package: Start with one 15mg  tablet by mouth twice a day with food. On Day 22, switch to one 20mg  tablet once a day with food.        Discharge Instructions: Please refer to Patient Instructions section of EMR for full details.  Patient was counseled important signs and symptoms that should prompt return to medical care, changes in medications, dietary instructions, activity restrictions, and follow up appointments.   Follow-Up Appointments: Follow-up Information   Follow up with Janit PaganENIOLA, KEHINDE, MD In 1 week. (If symptoms worsen, follow up sooner)    Specialty:  Family Medicine   Contact information:   [address] [place] [address] [phone]       Diane Pilarlexander Delilah Mulgrew, DO 07/06/2013, 7:07 PM PGY-1, [facility]

## 2013-07-08 ENCOUNTER — Telehealth: Payer: Self-pay | Admitting: Family Medicine

## 2013-07-08 ENCOUNTER — Other Ambulatory Visit: Payer: Self-pay | Admitting: Family Medicine

## 2013-07-08 MED ORDER — TRAMADOL HCL 50 MG PO TABS: 50.0000 mg | ORAL_TABLET | Freq: Three times a day (TID) | ORAL | Status: AC | PRN

## 2013-07-08 NOTE — Telephone Encounter (Signed)
Pt is aware of her options and is fine with trying a NSAID as long as it doesn't affect her blood clot.  She will let us know if this doesn't help with pain.  Ronold Hardgrove,CMA

## 2013-07-08 NOTE — Telephone Encounter (Signed)
Needs refill on percocet.   °

## 2013-07-08 NOTE — Telephone Encounter (Signed)
Please advise patient if pain is better to switch now to NSAID, I will not advised prolonged use of percocet. Another option would be Tramadol which I can call in to her pharmacy.

## 2013-07-08 NOTE — Telephone Encounter (Signed)
Pt wants tramadol called in

## 2013-07-08 NOTE — Telephone Encounter (Signed)
Tramadol called in to her pharmacy.

## 2013-07-08 NOTE — Telephone Encounter (Signed)
Tramadol called in to her pharmacy. 

## 2013-07-09 NOTE — ED Provider Notes (Signed)
Medical screening examination/treatment/procedure(s) were performed by non-physician practitioner and as supervising physician I was immediately available for consultation/collaboration.       Joban Colledge T Zipporah Finamore, MD 07/09/13 0411 

## 2013-07-10 ENCOUNTER — Ambulatory Visit (INDEPENDENT_AMBULATORY_CARE_PROVIDER_SITE_OTHER): Payer: Medicaid Other | Admitting: Family Medicine

## 2013-07-10 ENCOUNTER — Encounter: Payer: Self-pay | Admitting: Family Medicine

## 2013-07-10 VITALS — BP 130/85 | HR 74 | Temp 97.7°F | Ht 68.0 in | Wt 247.0 lb

## 2013-07-10 DIAGNOSIS — I824Y9 Acute embolism and thrombosis of unspecified deep veins of unspecified proximal lower extremity: Secondary | ICD-10-CM

## 2013-07-10 DIAGNOSIS — F17200 Nicotine dependence, unspecified, uncomplicated: Secondary | ICD-10-CM

## 2013-07-10 DIAGNOSIS — I2699 Other pulmonary embolism without acute cor pulmonale: Secondary | ICD-10-CM

## 2013-07-10 DIAGNOSIS — Z720 Tobacco use: Secondary | ICD-10-CM

## 2013-07-10 NOTE — Progress Notes (Signed)
   Subjective:    Patient ID: Diane [name], female    DOB: 1971-01-29, 43 y.o.   MRN: [id number]  HPI  Here for follow up from pulmonary emboli and DVT.  Feels well.  Had brief episode of mld pleuritic chest pain this am but has resolved.  Leg swelling is improved.  No problems taking Xarelto - no bleeding.  No worsening shortness of breath   Review of Symptoms - see HPI  PMH - Smoking status noted.  Has not smoked in more than a year    Review of Systems     Objective:   Physical Exam  Alert no acute distress Heart - Regular rate and rhythm.  No murmurs, gallops or rubs.    Lungs:  Normal respiratory effort, chest expands symmetrically. Lungs are clear to auscultation, no crackles or wheezes. Extremities:  No cyanosis, or deformity noted with good range of motion of all major joints.  Trace edema in R lower extrem No cords felt        Assessment & Plan:

## 2013-07-10 NOTE — Assessment & Plan Note (Signed)
See DVT note

## 2013-07-10 NOTE — Patient Instructions (Signed)
Good to see you today!  Thanks for coming in.  Come back in 1 month to see Dr Lum BabeEniola  Working on diet and exercise - aim to lose 2 lbs a week by cutting back on sweet drinks and overall calories   Call if any bleeding or worsening chest pain or more leg swelling   Keep taking xarelto every day  We can repeat your ECG in 6 months or so

## 2013-07-10 NOTE — Assessment & Plan Note (Signed)
Stable on xarelto.  No evident reversible factor so will continue anticoagulation at least 6 months.  Check hgb at next visit.  Patient had abnl ecg during ER work up that showed possible LAE most likely due to pulmonary emboli.  Consider rchecking in 6 mnths

## 2013-07-10 NOTE — Assessment & Plan Note (Signed)
No longer smoking - Congratulated and encouraged to continue

## 2013-07-10 NOTE — Telephone Encounter (Signed)
Spoke with patient and she is aware that medication was sent into pharmacy. Jazmin Hartsell,CMA

## 2013-07-12 NOTE — ED Provider Notes (Signed)
Medical screening examination/treatment/procedure(s) were conducted as a shared visit with non-physician practitioner(s) and myself.  I personally evaluated the patient during the encounter.     Date: 07/12/2013  Rate: 87  Rhythm: normal sinus rhythm  QRS Axis: normal  Intervals: normal  ST/T Wave abnormalities: normal  Conduction Disutrbances:none  Narrative Interpretation: No ST or T wave changes consistent with ischemia.    Old EKG Reviewed: none available  I interviewed and examined the patient. Lungs w/ mild rales in lung bases. Cardiac exam wnl. Abdomen soft.  Pt seen here and dx w/ PE recently. D/c home on xarelto. Pain initially controlled but now worsening w/ worsening sob. VS unremarkable. I feel it is necessary to r/o worsening clot burden given worsening sx. Will repeat CTA of chest. Plan for dispo per CT results.   Junius ArgyleForrest S Hayven Croy, MD 07/12/13 1044

## 2013-07-26 ENCOUNTER — Other Ambulatory Visit: Payer: Self-pay | Admitting: Family Medicine

## 2013-07-26 ENCOUNTER — Telehealth: Payer: Self-pay | Admitting: Family Medicine

## 2013-07-26 MED ORDER — RIVAROXABAN 20 MG PO TABS: 20.0000 mg | ORAL_TABLET | Freq: Every day | ORAL | Status: DC

## 2013-07-26 NOTE — Telephone Encounter (Signed)
Medication refilled

## 2013-07-26 NOTE — Telephone Encounter (Signed)
Need refill for her Xarelto 20mg  [facility] on [address].  Please contact if there are any problems or questions regarding this request.

## 2013-08-06 ENCOUNTER — Encounter: Payer: Self-pay | Admitting: Family Medicine

## 2013-08-06 ENCOUNTER — Ambulatory Visit (INDEPENDENT_AMBULATORY_CARE_PROVIDER_SITE_OTHER): Payer: Medicaid Other | Admitting: Family Medicine

## 2013-08-06 VITALS — BP 120/77 | HR 77 | Temp 98.2°F | Ht 68.0 in | Wt 238.0 lb

## 2013-08-06 DIAGNOSIS — Z0000 Encounter for general adult medical examination without abnormal findings: Secondary | ICD-10-CM | POA: Insufficient documentation

## 2013-08-06 DIAGNOSIS — I824Y9 Acute embolism and thrombosis of unspecified deep veins of unspecified proximal lower extremity: Secondary | ICD-10-CM

## 2013-08-06 DIAGNOSIS — I2699 Other pulmonary embolism without acute cor pulmonale: Secondary | ICD-10-CM

## 2013-08-06 NOTE — Assessment & Plan Note (Signed)
On Xarelto 20 mg. No Acute respiratory symptoms,feels much better.

## 2013-08-06 NOTE — Assessment & Plan Note (Signed)
Improved a lot. Continue Xarelto 20 mg qd for the next 5 months.

## 2013-08-06 NOTE — Patient Instructions (Signed)
Deep Vein Thrombosis  A deep vein thrombosis (DVT) is a blood clot that develops in the deep, larger veins of the leg, arm, or pelvis. These are more dangerous than clots that might form in veins near the surface of the body. A DVT can lead to complications if the clot breaks off and travels in the bloodstream to the lungs.   A DVT can damage the valves in your leg veins, so that instead of flowing upward, the blood pools in the lower leg. This is called post-thrombotic syndrome, and it can result in pain, swelling, discoloration, and sores on the leg.  CAUSES  Usually, several things contribute to blood clots forming. Contributing factors include:   The flow of blood slows down.   The inside of the vein is damaged in some way.   You have a condition that makes blood clot more easily.  RISK FACTORS  Some people are more likely than others to develop blood clots. Risk factors include:    Older age, especially over 75 years of age.   Having a family history of blood clots or if you have already had a blot clot.   Having major or lengthy surgery. This is especially true for surgery on the hip, knee, or belly (abdomen). Hip surgery is particularly high risk.   Breaking a hip or leg.   Sitting or lying still for a long time. This includes long-distance travel, paralysis, or recovery from an illness or surgery.   Having cancer or cancer treatment.   Having a long, thin tube (catheter) placed inside a vein during a medical procedure.   Being overweight (obese).   Pregnancy and childbirth.   Hormone changes make the blood clot more easily during pregnancy.   The fetus puts pressure on the veins of the pelvis.   There is a risk of injury to veins during delivery or a caesarean. The risk is highest just after childbirth.   Medicines with the female hormone estrogen. This includes birth control pills and hormone replacement therapy.   Smoking.   Other circulation or heart problems.    SIGNS AND SYMPTOMS  When  a clot forms, it can either partially or totally block the blood flow in that vein. Symptoms of a DVT can include:   Swelling of the leg or arm, especially if one side is much worse.   Warmth and redness of the leg or arm, especially if one side is much worse.   Pain in an arm or leg. If the clot is in the leg, symptoms may be more noticeable or worse when standing or walking.  The symptoms of a DVT that has traveled to the lungs (pulmonary embolism, PE) usually start suddenly and include:   Shortness of breath.   Coughing.   Coughing up blood or blood-tinged phlegm.   Chest pain. The chest pain is often worse with deep breaths.   Rapid heartbeat.  Anyone with these symptoms should get emergency medical treatment right away. Call your local emergency services (911 in the [place] if you have these symptoms.  DIAGNOSIS  If a DVT is suspected, your health care provider will take a full medical history and perform a physical exam. Tests that also may be required include:   Blood tests, including studies of the clotting properties of the blood.   Ultrasonography to see if you have clots in your legs or lungs.   X-rays to show the flow of blood when dye is injected into the veins (  venography).   Studies of your lungs if you have any chest symptoms.  PREVENTION   Exercise the legs regularly. Take a brisk 30-minute walk every day.   Maintain a weight that is appropriate for your height.   Avoid sitting or lying in bed for long periods of time without moving your legs.   Women, particularly those over the age of 35 years, should consider the risks and benefits of taking estrogen medicines, including birth control pills.   Do not smoke, especially if you take estrogen medicines.   Long-distance travel can increase your risk of DVT. You should exercise your legs by walking or pumping the muscles every hour.   In-hospital prevention:   Many of the risk factors above relate to situations that exist with  hospitalization, either for illness, injury, or elective surgery.   Your health care provider will assess you for the need for venous thromboembolism prophylaxis when you are admitted to the hospital. If you are having surgery, your surgeon will assess you the day of or day after surgery.   Prevention may include medical and nonmedical measures.  TREATMENT  Once identified, a DVT can be treated. It can also be prevented in some circumstances. Once you have had a DVT, you may be at increased risk for a DVT in the future. The most common treatment for DVT is blood thinning (anticoagulant) medicine, which reduces the blood's tendency to clot. Anticoagulants can stop new blood clots from forming and stop old ones from growing. They cannot dissolve existing clots. Your body does this by itself over time. Anticoagulants can be given by mouth, by IV access, or by injection. Your health care provider will determine the best program for you. Other medicines or treatments that may be used are:   Heparin or related medicines (low molecular weight heparin) are usually the first treatment for a blood clot. They act quickly. However, they cannot be taken orally.   Heparin can cause a fall in a component of blood that stops bleeding and forms blood clots (platelets). You will be monitored with blood tests to be sure this does not occur.   Warfarin is an anticoagulant that can be swallowed. It takes a few days to start working, so usually heparin or related medicines are used in combination. Once warfarin is working, heparin is usually stopped.   Less commonly, clot dissolving drugs (thrombolytics) are used to dissolve a DVT. They carry a high risk of bleeding, so they are used mainly in severe cases, where your life or a limb is threatened.   Very rarely, a blood clot in the leg needs to be removed surgically.   If you are unable to take anticoagulants, your health care provider may arrange for you to have a filter placed  in a main vein in your abdomen. This filter prevents clots from traveling to your lungs.  HOME CARE INSTRUCTIONS   Take all medicines prescribed by your health care provider. Only take over-the-counter or prescription medicines for pain, fever, or discomfort as directed by your health care provider.   Warfarin. Most people will continue taking warfarin after hospital discharge. Your health care provider will advise you on the length of treatment (usually 3 6 months, sometimes lifelong).   Too much and too little warfarin are both dangerous. Too much warfarin increases the risk of bleeding. Too little warfarin continues to allow the risk for blood clots. While taking warfarin, you will need to have regular blood tests to measure your   blood clotting time. These blood tests usually include both the prothrombin time (PT) and international normalized ratio (INR) tests. The PT and INR results allow your health care provider to adjust your dose of warfarin. The dose can change for many reasons. It is critically important that you take warfarin exactly as prescribed, and that you have your PT and INR levels drawn exactly as directed.   Many foods, especially foods high in vitamin K, can interfere with warfarin and affect the PT and INR results. Foods high in vitamin K include spinach, kale, broccoli, cabbage, collard and turnip greens, brussel sprouts, peas, cauliflower, seaweed, and parsley as well as beef and pork liver, green tea, and soybean oil. You should eat a consistent amount of foods high in vitamin K. Avoid major changes in your diet, or notify your health care provider before changing your diet. Arrange a visit with a dietitian to answer your questions.   Many medicines can interfere with warfarin and affect the PT and INR results. You must tell your health care provider about any and all medicines you take. This includes all vitamins and supplements. Be especially cautious with aspirin and  anti-inflammatory medicines. Ask your health care provider before taking these. Do not take or discontinue any prescribed or over-the-counter medicine except on the advice of your health care provider or pharmacist.   Warfarin can have side effects, primarily excessive bruising or bleeding. You will need to hold pressure over cuts for longer than usual. Your health care provider or pharmacist will discuss other potential side effects.   Alcohol can change the body's ability to handle warfarin. It is best to avoid alcoholic drinks or consume only very small amounts while taking warfarin. Notify your health care provider if you change your alcohol intake.   Notify your dentist or other health care providers before procedures.   Activity. Ask your health care provider how soon you can go back to normal activities. It is important to stay active to prevent blood clots. If you are on anticoagulant medicine, avoid contact sports.   Exercise. It is very important to exercise. This is especially important while traveling, sitting, or standing for long periods of time. Exercise your legs by walking or by pumping the muscles frequently. Take frequent walks.   Compression stockings. These are tight elastic stockings that apply pressure to the lower legs. This pressure can help keep the blood in the legs from clotting. You may need to wear compression stockings at home to help prevent a DVT.   Do not smoke. If you smoke, quit. Ask your health care provider for help with quitting smoking.   Learn as much as you can about DVT. Knowing more about the condition should help you keep it from coming back.   Wear a medical alert bracelet or carry a medical alert card.  SEEK MEDICAL CARE IF:   You notice a rapid heartbeat.   You feel weaker or more tired than usual.   You feel faint.   You notice increased bruising.   You feel your symptoms are not getting better in the time expected.   You believe you are having side  effects of medicine.  SEEK IMMEDIATE MEDICAL CARE IF:   You have chest pain.   You have trouble breathing.   You have new or increased swelling or pain in one leg.   You cough up blood.   You notice blood in vomit, in a bowel movement, or in urine.  MAKE SURE   YOU:   Understand these instructions.   Will watch your condition.   Will get help right away if you are not doing well or get worse.  Document Released: 05/09/2005 Document Revised: 02/27/2013 Document Reviewed: 01/14/2013  [employer] Patient Information 2014 [employer].

## 2013-08-06 NOTE — Assessment & Plan Note (Signed)
Up to date with vaccination. Need PAP, she will schedule in 4 wks.

## 2013-08-06 NOTE — Progress Notes (Signed)
Subjective:     Patient ID: Diane [name], female   DOB: 22-Mar-1971, 43 y.o.   MRN: [id number]  HPI DVT/PE: here for follow up, currently on Xarelto 20 mg qd, denies any brushing or bleeding.She denies leg swelling, no SOB,no chest pain.  HM:Has not had PAP recently.  Current Outpatient Prescriptions on File Prior to Visit  Medication Sig Dispense Refill  . Calcium-Vitamin D-Vitamin K (CALCIUM SOFT CHEWS PO) Take 1-2 tablets by mouth daily.      Marland Kitchen. levothyroxine (SYNTHROID, LEVOTHROID) 175 MCG tablet Take 175 mcg by mouth daily.      . Rivaroxaban (XARELTO) 20 MG TABS tablet Take 1 tablet (20 mg total) by mouth daily with supper.  30 tablet  2   No current facility-administered medications on file prior to visit.   Past Medical History  Diagnosis Date  . Depression   . Thyroid disease      Review of Systems  Unable to perform ROS Respiratory: Negative.   Cardiovascular: Negative.   Gastrointestinal: Negative.   Musculoskeletal: Negative.   All other systems reviewed and are negative.   Filed Vitals:   08/06/13 1125  BP: 120/77  Pulse: 77  Temp: 98.2 F (36.8 C)  TempSrc: Oral  Height: 5\' 8"  (1.727 m)  Weight: 238 lb (107.956 kg)  SpO2: 98%      Objective:   Physical Exam  Nursing note and vitals reviewed. Constitutional: She is oriented to person, place, and time. She appears well-developed. No distress.  Cardiovascular: Normal rate, regular rhythm, normal heart sounds and intact distal pulses.   No murmur heard. Pulmonary/Chest: Effort normal and breath sounds normal. No respiratory distress. She has no wheezes.  Abdominal: Soft. Bowel sounds are normal. She exhibits no distension and no mass. There is no tenderness.  Musculoskeletal: Normal range of motion. She exhibits no edema.  Neurological: She is alert and oriented to person, place, and time. No cranial nerve deficit.       Assessment:     DVT PE Health maintenance     Plan:     Check problem  list.      DVT PE

## 2013-08-19 ENCOUNTER — Other Ambulatory Visit: Payer: Self-pay | Admitting: Family Medicine

## 2013-10-29 ENCOUNTER — Other Ambulatory Visit: Payer: Self-pay | Admitting: Family Medicine

## 2013-10-31 ENCOUNTER — Ambulatory Visit (INDEPENDENT_AMBULATORY_CARE_PROVIDER_SITE_OTHER): Payer: BC Managed Care – PPO | Admitting: Family Medicine

## 2013-10-31 ENCOUNTER — Encounter: Payer: Self-pay | Admitting: Family Medicine

## 2013-10-31 VITALS — BP 124/84 | HR 88 | Temp 99.0°F | Wt 230.0 lb

## 2013-10-31 DIAGNOSIS — R21 Rash and other nonspecific skin eruption: Secondary | ICD-10-CM

## 2013-10-31 MED ORDER — TRIAMCINOLONE ACETONIDE 0.1 % EX CREA: 1.0000 "application " | TOPICAL_CREAM | Freq: Two times a day (BID) | CUTANEOUS | Status: DC

## 2013-10-31 NOTE — Patient Instructions (Signed)
Not sure of the cause of rash.  Let's try a steroid cream again to try to calm it down.  Come back if doesn't resolve within 3-4 weeks (2 rounds of treatment) or if symptoms worsen or spread  Thanks, Dr. Durene Cal  Talk to Dr. Lum Babe about: Health Maintenance Due  Topic Date Due  . Pap Smear  04/09/2013  . Tetanus/tdap  08/21/2013

## 2013-11-01 DIAGNOSIS — R21 Rash and other nonspecific skin eruption: Secondary | ICD-10-CM | POA: Insufficient documentation

## 2013-11-01 NOTE — Progress Notes (Signed)
  Tana ConchStephen Hunter, MD Phone: [phone]  Subjective:   Diane NorfolkKeri L [name] is a 43 y.o. year old very pleasant female patient who presents with the following:  Rash Patient with papular pruritic erythematous rash on bilateral shins x 3 months. States was seen 3 years ago for similar rash which resolved with steroid cream. Rash has not been worsening or changing. Stays over shins only and is not spreading. Denies contact with new substance within 3 months or changes in detergents/fabrics. She has no pets that would rub on her legs. She lives with 2 daughters and neither of them have a similar rash. Does have mild soreness.  ROS-not ill appearing, no fever/chills. No new medications (other than Xarelto in February). Not immunocompromised. No mucus membrane involvement.   Past Medical History- history of DVT and PE in February now on xarelto, former smoker, hypothyroidism (has been well controlled), depression  Medications- reviewed and updated Current Outpatient Prescriptions  Medication Sig Dispense Refill  . Calcium-Vitamin D-Vitamin K (CALCIUM SOFT CHEWS PO) Take 1-2 tablets by mouth daily.      Marland Kitchen. levothyroxine (SYNTHROID, LEVOTHROID) 175 MCG tablet TAKE ONE TABLET BY MOUTH ONCE DAILY BEFORE  BREAKFAST  90 tablet  1  . XARELTO 20 MG TABS tablet TAKE ONE TABLET BY MOUTH ONCE DAILY WITH  SUPPER  30 tablet  2     Objective: BP 124/84  Pulse 88  Temp(Src) 99 F (37.2 C) (Oral)  Wt 230 lb (104.327 kg)  LMP 09/30/2013 Gen: NAD, resting comfortably on table Skin: warm, dry, no rash except on bilateral lower extermities on anterior side Patient with multiple excoriated papules (not necessarily over hair follicles) with erythematous base.   Neuro: intact distal sensation  Ext: no edema CV: 2+ PT pulses  Assessment/Plan:  Rash and nonspecific skin eruption Pretibial papular pruritic erythematous rash. Unknown etiology with no obvious contacts. Resolved 3 years ago with steroid treatment so  will trial again at this time. Follow up if no improvement within 2 cycles of treatment or if worsens.    Meds ordered this encounter  Medications  . triamcinolone cream (KENALOG) 0.1 %    Sig: Apply 1 application topically 2 (two) times daily. For 10 days (can try again in a week if needed)    Dispense:  85.2 g    Refill:  1

## 2013-11-01 NOTE — Assessment & Plan Note (Signed)
Pretibial papular pruritic erythematous rash. Unknown etiology with no obvious contacts. Resolved 3 years ago with steroid treatment so will trial again at this time. Follow up if no improvement within 2 cycles of treatment or if worsens.

## 2014-01-01 ENCOUNTER — Other Ambulatory Visit: Payer: Self-pay | Admitting: Family Medicine

## 2014-01-01 ENCOUNTER — Telehealth: Payer: Self-pay | Admitting: Family Medicine

## 2014-01-01 MED ORDER — ASPIRIN 325 MG PO TABS: 325.0000 mg | ORAL_TABLET | Freq: Every day | ORAL | Status: DC

## 2014-01-01 NOTE — Telephone Encounter (Signed)
Patient is supposed to complete 6 months of anticoagulant which will be Aug 14,2015,i.e 2 more days. May hold Xarelto till next appointment. If concern about thromboembolism may use ASA 325mg  pending f/u with me.

## 2014-01-01 NOTE — Telephone Encounter (Signed)
Please have patient follow up with me to discuss management.

## 2014-01-01 NOTE — Telephone Encounter (Signed)
Patient is supposed to complete 6 months of anticoagulant which will be Aug 14,2015,i.e 2 more days. May hold Xarelto till next appointment. If concern about thromboembolism may use ASA 325mg pending f/u with me. 

## 2014-01-01 NOTE — Telephone Encounter (Signed)
She is aware of this and will plan to pick up asa. Diane [name],CMA]

## 2014-01-01 NOTE — Telephone Encounter (Signed)
Pt called because she is taking Xarelto and now that she has insurance instead of medicaid she can not afford the medication. She wanted to know if she can either stop taking it or can something else similar be called in that she could afford. Please call her at [phone]. jw

## 2014-01-01 NOTE — Telephone Encounter (Signed)
Spoke with patient regarding her medication and needing an appt.  She is out medication as of yesterday and would like to know what she needs to do in the meantime til her appt on 01/07/14. Jazmin Hartsell,CMA

## 2014-01-07 ENCOUNTER — Encounter: Payer: Self-pay | Admitting: Family Medicine

## 2014-01-07 ENCOUNTER — Ambulatory Visit (INDEPENDENT_AMBULATORY_CARE_PROVIDER_SITE_OTHER): Payer: BC Managed Care – PPO | Admitting: Family Medicine

## 2014-01-07 VITALS — BP 118/78 | HR 67 | Temp 98.0°F | Wt 228.0 lb

## 2014-01-07 DIAGNOSIS — I2699 Other pulmonary embolism without acute cor pulmonale: Secondary | ICD-10-CM | POA: Insufficient documentation

## 2014-01-07 DIAGNOSIS — E039 Hypothyroidism, unspecified: Secondary | ICD-10-CM

## 2014-01-07 DIAGNOSIS — R0602 Shortness of breath: Secondary | ICD-10-CM

## 2014-01-07 DIAGNOSIS — E669 Obesity, unspecified: Secondary | ICD-10-CM

## 2014-01-07 DIAGNOSIS — I82409 Acute embolism and thrombosis of unspecified deep veins of unspecified lower extremity: Secondary | ICD-10-CM | POA: Insufficient documentation

## 2014-01-07 DIAGNOSIS — R21 Rash and other nonspecific skin eruption: Secondary | ICD-10-CM

## 2014-01-07 DIAGNOSIS — I749 Embolism and thrombosis of unspecified artery: Secondary | ICD-10-CM

## 2014-01-07 MED ORDER — MUPIROCIN 2 % EX OINT: TOPICAL_OINTMENT | CUTANEOUS | Status: DC

## 2014-01-07 MED ORDER — TRIAMCINOLONE ACETONIDE 0.1 % EX CREA: 1.0000 "application " | TOPICAL_CREAM | Freq: Two times a day (BID) | CUTANEOUS | Status: DC

## 2014-01-07 NOTE — Assessment & Plan Note (Signed)
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 Completed 6 months of Xarelto. I think this is sufficient enough. I recommended screening for thrombophilia to assess for prolong use of anticoagulant. Plan to return in few days for test to give time for Xarelto to clear of system completely prior to testing. Check protein C &S, Antiphospholipids

## 2014-01-07 NOTE — Progress Notes (Signed)
Subjective:     Patient ID: Diane [name], female   DOB: March 07, 1971, 43 y.o.   MRN: [id number]  HPI PE/DVT:She was on Xarelto for 6 months with hx of PE and DVT diagnosed Feb 2015. Since then she denies any chest pain, no calf pain or swelling. She d/c Xarelto on Aug 12th due to cost. SOB: On and off at times with exercise, not that bad. Denies any leg swelling. Denies chest pain, no palpitation. Hypothyroidism: Compliant with synthroid. Here for follow up. Obesity: working on weight loss. Rash: C/O rash on her shin for the last few months which improved some on Triamcinolone but she continues to have tiny bumps with pus in them. She denies fever, no change in lotion or diet or stockings.  Current Outpatient Prescriptions on File Prior to Visit  Medication Sig Dispense Refill  . aspirin 325 MG tablet Take 1 tablet (325 mg total) by mouth daily.  30 tablet  1  . Calcium-Vitamin D-Vitamin K (CALCIUM SOFT CHEWS PO) Take 1-2 tablets by mouth daily.      Marland Kitchen. levothyroxine (SYNTHROID, LEVOTHROID) 175 MCG tablet TAKE ONE TABLET BY MOUTH ONCE DAILY BEFORE  BREAKFAST  90 tablet  1  . triamcinolone cream (KENALOG) 0.1 % Apply 1 application topically 2 (two) times daily. For 10 days (can try again in a week if needed)  85.2 g  1   No current facility-administered medications on file prior to visit.   Past Medical History  Diagnosis Date  . Depression   . Thyroid disease   . DVT (deep venous thrombosis)   . Pulmonary embolism       Review of Systems  Respiratory: Negative.   Cardiovascular: Negative.   Gastrointestinal: Negative.   Genitourinary: Negative.   Skin: Positive for rash.  Neurological: Negative.   All other systems reviewed and are negative.  Filed Vitals:   01/07/14 1137  BP: 118/78  Pulse: 67  Temp: 98 F (36.7 C)  TempSrc: Oral  Weight: 228 lb (103.42 kg)  SpO2: 99%       Objective:   Physical Exam  Nursing note and vitals reviewed. Constitutional: She is  oriented to person, place, and time. She appears well-developed. No distress.  Cardiovascular: Normal rate, regular rhythm, normal heart sounds and intact distal pulses.   No murmur heard. Pulmonary/Chest: Effort normal and breath sounds normal. No respiratory distress. She has no wheezes. She exhibits no tenderness.  Abdominal: Soft. Bowel sounds are normal. She exhibits no distension and no mass. There is no tenderness.  Musculoskeletal: She exhibits edema.  B/L trace pedal edema  Neurological: She is alert and oriented to person, place, and time. No cranial nerve deficit.  Skin:  Few pustulo-papular rash on shin b/l with mild surrounding erythema.       Assessment:     Thromboembolism: DVT /PE SOB Hypothyroidism Obesity Skin rash     Plan:     Check problem list.

## 2014-01-07 NOTE — Assessment & Plan Note (Addendum)
Compliant with current regimen. Return for TSH in 3 days.

## 2014-01-07 NOTE — Assessment & Plan Note (Addendum)
Weight improved some from last visit. FLP ordered to return for blood work fasting.

## 2014-01-07 NOTE — Assessment & Plan Note (Signed)
O2 sat normal. ECHO ordered to r/o CHF with + trace pedal swelling. Pulmonary exam benign.

## 2014-01-07 NOTE — Patient Instructions (Addendum)
It was nice seeing you today. I am glad you feel better with chest pain. I feel it is ok to now come of Xarelto since you have been on it for 6 months already. However I will to screen you for blood disorder that causes blood cloth. I will have you return this Friday for blood work. I will also obtain ECHO because of intermittent SOB. Please call if having any concern.

## 2014-01-07 NOTE — Assessment & Plan Note (Signed)
Unclear etiology. Rash looks like resolving impetigo vs folliculitis. Improved in the past on Triamcinolone, refill given. I also prescribed Mupirocin, she is to use this more till pustular lesion resolves. F/U if worsening.

## 2014-01-10 ENCOUNTER — Ambulatory Visit (HOSPITAL_COMMUNITY)
Admission: RE | Admit: 2014-01-10 | Discharge: 2014-01-10 | Disposition: A | Discharge status: Home / Self Care | Payer: BC Managed Care – PPO | Source: Ambulatory Visit | Attending: Family Medicine | Admitting: Family Medicine

## 2014-01-10 ENCOUNTER — Other Ambulatory Visit: Payer: BC Managed Care – PPO

## 2014-01-10 DIAGNOSIS — R0602 Shortness of breath: Secondary | ICD-10-CM

## 2014-01-10 DIAGNOSIS — R0989 Other specified symptoms and signs involving the circulatory and respiratory systems: Principal | ICD-10-CM | POA: Insufficient documentation

## 2014-01-10 DIAGNOSIS — E039 Hypothyroidism, unspecified: Secondary | ICD-10-CM

## 2014-01-10 DIAGNOSIS — E669 Obesity, unspecified: Secondary | ICD-10-CM

## 2014-01-10 DIAGNOSIS — R0609 Other forms of dyspnea: Secondary | ICD-10-CM | POA: Insufficient documentation

## 2014-01-10 DIAGNOSIS — I749 Embolism and thrombosis of unspecified artery: Secondary | ICD-10-CM

## 2014-01-10 NOTE — Progress Notes (Signed)
  Echocardiogram 2D Echocardiogram has been performed.  Leta JunglingCooper, Jacklynn Dehaas M 01/10/2014, 9:40 AM

## 2014-01-10 NOTE — Progress Notes (Signed)
Drew: Factor 5 leiden, Protein C total, Protein S - total and free, Lupus Anticoagulant panel, Cardiolipin Anti - IgG, IgM, IgA, TSH, FLP

## 2014-01-11 LAB — LIPID PANEL
Cholesterol: 193 mg/dL (ref 0–200)
HDL: 59 mg/dL (ref >39)
LDL CALC: 106 mg/dL — AB (ref 0–99)
TRIGLYCERIDES: 142 mg/dL (ref <150)
Total CHOL/HDL Ratio: 3.3 Ratio
VLDL: 28 mg/dL (ref 0–40)

## 2014-01-11 LAB — TSH: TSH: 0.088 u[IU]/mL — ABNORMAL LOW (ref 0.350–4.500)

## 2014-01-13 ENCOUNTER — Telehealth: Payer: Self-pay | Admitting: Family Medicine

## 2014-01-13 LAB — LUPUS ANTICOAGULANT PANEL
DRVVT: 41 secs (ref <42.9)
LUPUS ANTICOAGULANT: NOT DETECTED
PTT Lupus Anticoagulant: 31.8 secs (ref 28.0–43.0)

## 2014-01-13 LAB — CARDIOLIPIN ANTIBODIES, IGG, IGM, IGA
Anticardiolipin IgA: 6 APL U/mL (ref <22)
Anticardiolipin IgG: 6 GPL U/mL (ref <23)
Anticardiolipin IgM: 1 MPL U/mL (ref <11)

## 2014-01-13 LAB — PROTEIN C, TOTAL: PROTEIN C, TOTAL: 95 % (ref 72–160)

## 2014-01-13 LAB — PROTEIN S, TOTAL: Protein S Total: 108 % (ref 60–150)

## 2014-01-13 NOTE — Telephone Encounter (Signed)
Message left to call back for test result. 

## 2014-01-14 ENCOUNTER — Telehealth: Payer: Self-pay | Admitting: Family Medicine

## 2014-01-14 LAB — FACTOR 5 LEIDEN

## 2014-01-14 NOTE — Telephone Encounter (Signed)
Pt called from work and would like someone to call her with her lab results. Please call her job at [phone] ext [phone]. jw

## 2014-01-15 LAB — PROTEIN S, ANTIGEN, FREE: Protein S Ag, Free: 120 % normal (ref 50–147)

## 2014-01-15 MED ORDER — LEVOTHYROXINE SODIUM 150 MCG PO TABS: 150.0000 ug | ORAL_TABLET | Freq: Every day | ORAL | Status: DC

## 2014-01-15 NOTE — Telephone Encounter (Signed)
I discussed lab result and ECHO with patient. Antiphospholipid test negative. Protein C & S wnl Protein S Ag, Free still pending but my guess is that it will be wnl. Factor V gene negative. At this point we can safely d/c anticoagulant since she completed her 6 months course. May continue ASA 81 mg qd. Patient instructed to go to the ED if SOB or calf pain to be reassess, if positive clot then she will need to be on anticoagulant for a lifetime. ECHO showed grade 1 diastolic dysfunction. LDL slightly elevated, diet and exercise counseling done. TSH low, plan to reduce synthroid dose. She verbalized understanding of all instructions and agreed with plan.

## 2014-07-11 ENCOUNTER — Other Ambulatory Visit: Payer: Self-pay | Admitting: *Deleted

## 2014-07-11 MED ORDER — LEVOTHYROXINE SODIUM 150 MCG PO TABS: 150.0000 ug | ORAL_TABLET | Freq: Every day | ORAL | Status: DC

## 2014-09-04 ENCOUNTER — Telehealth: Payer: Self-pay | Admitting: Family Medicine

## 2014-09-04 ENCOUNTER — Ambulatory Visit (INDEPENDENT_AMBULATORY_CARE_PROVIDER_SITE_OTHER): Payer: BLUE CROSS/BLUE SHIELD | Admitting: Family Medicine

## 2014-09-04 ENCOUNTER — Encounter: Payer: Self-pay | Admitting: Family Medicine

## 2014-09-04 VITALS — BP 158/102 | HR 72 | Temp 98.1°F | Ht 68.0 in | Wt 236.2 lb

## 2014-09-04 DIAGNOSIS — IMO0001 Reserved for inherently not codable concepts without codable children: Secondary | ICD-10-CM

## 2014-09-04 DIAGNOSIS — R21 Rash and other nonspecific skin eruption: Secondary | ICD-10-CM | POA: Diagnosis not present

## 2014-09-04 DIAGNOSIS — R030 Elevated blood-pressure reading, without diagnosis of hypertension: Secondary | ICD-10-CM | POA: Diagnosis not present

## 2014-09-04 MED ORDER — FLUOCINONIDE-E 0.05 % EX CREA: 1.0000 "application " | TOPICAL_CREAM | Freq: Two times a day (BID) | CUTANEOUS | Status: DC

## 2014-09-04 NOTE — Telephone Encounter (Signed)
Please call patient and inform her that her blood pressure was elevated on her visit today, I would like her to follow-up with in one week, with a nurse, visit to have her blood pressure rechecked. If she notices any symptoms, dizziness, chest pain, palpitations increased headache she should be seen sooner. Thank you

## 2014-09-04 NOTE — Progress Notes (Signed)
   Subjective:    Patient ID: Diane NorfolkKeri L Risinger, female    DOB: [birth year], 44 y.o.   MRN: [id number]  HPI  Rash: Patient presents to family medicine today is same-day appointment for a rash on her bilateral shins. Patient states that this rash has been off and on for 2-3 years. Initially when it started she was tried on Kenalog steroid cream and it cleared. She states that intermittently comes back, and she cannot find reasoning for this. She denies any new detergents, creams, lotions or soaps. She denies rashes anywhere else on her body ever. The rash is pruritic. It does not drain become infected. She tried her father's steroid cream, which is Lidex, and it took away. However returns intermittently every few months. Patient does not notice it to be increased after shaving. She has been tried on Bactroban for possible folliculitis in the past, and this was not helpful at all.  Former smoker Past Medical History  Diagnosis Date  . Depression   . Thyroid disease   . DVT (deep venous thrombosis)   . Pulmonary embolism    No Known Allergies  Review of Systems Per HPI    Objective:   Physical Exam BP 158/102 mmHg  Pulse 72  Temp(Src) 98.1 F (36.7 C) (Oral)  Ht 5\' 8"  (1.727 m)  Wt 236 lb 3.2 oz (107.14 kg)  BMI 35.92 kg/m2  LMP 08/04/2014 (Approximate) Gen: NAD. On toxic in appearance, well-developed, well-nourished, obese Caucasian female. Skin:  No purpura or petechiae. No edema. Scaly, mildly erythemic maculopapular rash bilateral shins. +2/4 PT. Good cap refill.     Assessment & Plan:

## 2014-09-04 NOTE — Telephone Encounter (Signed)
LVM for pt to call back just to be sure that she followed up with her PCP in regards to her BP. Sunday SpillersSharon T Saunders

## 2014-09-04 NOTE — Assessment & Plan Note (Addendum)
Patient has been using her father's Lidex cream, and as had great results. I've advised her that this cream is a more potent steroid cream and she had used in the past, and should not be used more than 2 weeks in a row. She should not use this on her face, armpits or groin. Will prescribe this medication for her to use for 2 weeks, patient advised to only use it until the rash goes away or 2 weeks whichever comes first. If the rash does not resolve in 2 weeks and is to return at that time we can send her either to dermatology or do a skin biopsy to find cause. She is in understanding.

## 2014-09-04 NOTE — Patient Instructions (Signed)
I have called in the Lidex cream for you, uses only for 2 weeks or until your rash goes away whichever comes first. Use this cream on your face, axilla or groin area. If the area is not resolved within 2 weeks, make an appointment to be seen again and at that time we can discuss a biopsy of one of the lesions or dermatology referral. Try to avoid shaving over the next few weeks or any new soaps detergents etc. Was a pleasure meeting you today.

## 2014-09-04 NOTE — Assessment & Plan Note (Signed)
Issues with elevated blood pressure in office today, has had borderline blood pressures in the past, will call patient in have her follow-up with nurse visit for blood pressure reading. If elevated will likely need an appointment with discussion on blood pressure control and possible medication start. Follow-up one week

## 2014-09-05 NOTE — Telephone Encounter (Signed)
Pt has appt on 09/10/14 for a BP recheck. Diane [name], Diane [name]

## 2014-09-10 ENCOUNTER — Ambulatory Visit (INDEPENDENT_AMBULATORY_CARE_PROVIDER_SITE_OTHER): Payer: BLUE CROSS/BLUE SHIELD | Admitting: *Deleted

## 2014-09-10 VITALS — BP 128/70 | HR 68

## 2014-09-10 DIAGNOSIS — Z0130 Encounter for examination of blood pressure without abnormal findings: Secondary | ICD-10-CM

## 2014-09-10 DIAGNOSIS — Z136 Encounter for screening for cardiovascular disorders: Secondary | ICD-10-CM

## 2014-09-10 NOTE — Progress Notes (Signed)
   Pt in nurse clinic for blood pressure check.  BP 128/70 manually, heart rate 68.  Pt denies any symptoms today.  Will forward to PCP.  Clovis PuMartin, Broderic Bara L, RN

## 2014-10-28 ENCOUNTER — Other Ambulatory Visit: Payer: Self-pay | Admitting: *Deleted

## 2014-10-28 MED ORDER — LEVOTHYROXINE SODIUM 150 MCG PO TABS: 150.0000 ug | ORAL_TABLET | Freq: Every day | ORAL | Status: DC

## 2014-10-28 NOTE — Telephone Encounter (Signed)
Received a fax from [facility] stating pt's insurance requires a 90 day supply of Levothyroxine.  Clovis PuMartin, Cameryn Schum L, RN

## 2014-10-29 ENCOUNTER — Telehealth: Payer: Self-pay | Admitting: Family Medicine

## 2015-01-31 ENCOUNTER — Other Ambulatory Visit: Payer: Self-pay | Admitting: Family Medicine

## 2015-04-01 ENCOUNTER — Ambulatory Visit (INDEPENDENT_AMBULATORY_CARE_PROVIDER_SITE_OTHER): Payer: BLUE CROSS/BLUE SHIELD | Admitting: Family Medicine

## 2015-04-01 ENCOUNTER — Encounter: Payer: Self-pay | Admitting: Family Medicine

## 2015-04-01 VITALS — BP 122/88 | HR 75 | Temp 97.9°F | Ht 68.5 in | Wt 235.2 lb

## 2015-04-01 DIAGNOSIS — R21 Rash and other nonspecific skin eruption: Secondary | ICD-10-CM

## 2015-04-01 MED ORDER — TRIAMCINOLONE ACETONIDE 0.5 % EX OINT: 1.0000 "application " | TOPICAL_OINTMENT | Freq: Two times a day (BID) | CUTANEOUS | Status: DC

## 2015-04-01 NOTE — Assessment & Plan Note (Signed)
Recurrent worsened.  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

## 2015-04-01 NOTE — Patient Instructions (Addendum)
Good to see you today!  Thanks for coming in.  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

## 2015-04-01 NOTE — Progress Notes (Signed)
   Subjective:    Patient ID: Diane [name], female    DOB: 05-27-1970, 44 y.o.   MRN: [id number]  HPI  RASH  Had rash for on and off for a year or so.  Has been treated with steroids several courses which help but it recurrs when she stops Location: only on bilateral lower legs Medications tried: steroids Similar rash in past: on and off New medications or antibiotics: no Tick, Insect or new pet exposure: no Recent travel: no New detergent or soap: no Immunocompromised: no  Symptoms Itching: yes Pain over rash: no Feeling ill all over: no Fever: no Mouth sores: no Face or tongue swelling: no Trouble breathing: no Joint swelling or pain: no  However her legs do swell some at the end of the day and she does itch them then  Review of Symptoms - see HPI PMH - Smoking status noted.      Review of Systems     Objective:   Physical Exam Alert nad Rash - confined to below knee and above ankle primarily on anter surface Red raised confluent nontender with a small area of weeping Mild edema bilaterlly        Assessment & Plan:

## 2015-08-03 ENCOUNTER — Other Ambulatory Visit: Payer: Self-pay | Admitting: Family Medicine

## 2015-08-03 NOTE — Telephone Encounter (Signed)
Need follow up for subsequent refill

## 2015-12-25 ENCOUNTER — Encounter: Payer: Self-pay | Admitting: Internal Medicine

## 2015-12-25 ENCOUNTER — Ambulatory Visit (INDEPENDENT_AMBULATORY_CARE_PROVIDER_SITE_OTHER): Payer: BLUE CROSS/BLUE SHIELD | Admitting: Internal Medicine

## 2015-12-25 DIAGNOSIS — J069 Acute upper respiratory infection, unspecified: Secondary | ICD-10-CM | POA: Diagnosis not present

## 2015-12-25 NOTE — Patient Instructions (Signed)
Please use Mucinex to help get mucous up and Robitussin to help with cough, and for better night time sleep if the cough keeps you up. If you are not feeling better by the end of next week please come back in and see [place].

## 2015-12-25 NOTE — Assessment & Plan Note (Signed)
Likely viral. Patient did not want prescription, preferred to get over the counter.  - Mucinex as an expectorant  - Flonase for congestion - Robitussin for cough irritation

## 2015-12-25 NOTE — Progress Notes (Signed)
   [facility] Noralee Chars, MD Phone: [phone]  Reason For Visit: Diane [name]   # Patient with history of cold starting on Sunday night. Initially noted. Having a sore throat. Then developed congestion and cough. She states she felt some chest tightness associated with the cough and initially did not have much mucus. However has now been having yellow mucus. Patient has taken nothing other than NyQuil one night. However denies cold symptoms keeping her up at night. No fevers or chills. no nausea or vomiting.no muscle aches.   Past Medical History -No hx of COPD or asthma  Reviewed problem list.  Medications- reviewed and updated No additions to family history Social history- patient is a non smoker  Objective: BP (!) 146/85   Pulse 80   Temp 98.2 F (36.8 C) (Oral)   Ht 5\' 8"  (1.727 m)   Wt 226 lb 9.6 oz (102.8 kg)   BMI 34.45 kg/m  Gen: NAD, alert, cooperative with exam HEENT: TMs nml, erythematous turbinates Neck: FROM, supple CV: RRR, good S1/S2, no murmur,  Resp: CTABL, no wheezes, non-labored  Assessment/Plan: See problem based a/p Acute upper respiratory infection Likely viral. Patient did not want prescription, preferred to get over the counter.  - Mucinex as an expectorant  - Flonase for congestion - Robitussin for cough irritation

## 2016-01-29 ENCOUNTER — Telehealth: Payer: Self-pay | Admitting: Family Medicine

## 2016-01-29 ENCOUNTER — Other Ambulatory Visit: Payer: Self-pay | Admitting: Family Medicine

## 2016-01-29 NOTE — Telephone Encounter (Signed)
Patient need to be seen for Thyroid assessment. Last seen for this was 2 yrs ago. I have refilled synthroid for 1 month. Please help her schedule appointment to see me soon. No subsequent refills till then. Thanks. 

## 2016-01-29 NOTE — Telephone Encounter (Signed)
Left voice message for patient to call for an appointment.  Martin, Tamika L, RN  

## 2016-01-29 NOTE — Telephone Encounter (Signed)
Patient need to be seen for Thyroid assessment. Last seen for this was 2 yrs ago. I have refilled synthroid for 1 month. Please help her schedule appointment to see me soon. No subsequent refills till then. Thanks.

## 2016-02-01 NOTE — Telephone Encounter (Signed)
Patient scheduled for 9-12. Jazmin Hartsell,CMA

## 2016-02-02 ENCOUNTER — Ambulatory Visit: Payer: BLUE CROSS/BLUE SHIELD | Admitting: Family Medicine

## 2016-02-05 ENCOUNTER — Encounter: Payer: Self-pay | Admitting: Family Medicine

## 2016-02-05 ENCOUNTER — Ambulatory Visit: Payer: BLUE CROSS/BLUE SHIELD | Admitting: Family Medicine

## 2016-02-05 ENCOUNTER — Ambulatory Visit (INDEPENDENT_AMBULATORY_CARE_PROVIDER_SITE_OTHER): Payer: BLUE CROSS/BLUE SHIELD | Admitting: Family Medicine

## 2016-02-05 VITALS — BP 128/84 | HR 73 | Temp 97.7°F | Ht 68.0 in | Wt 223.0 lb

## 2016-02-05 DIAGNOSIS — E039 Hypothyroidism, unspecified: Secondary | ICD-10-CM

## 2016-02-05 DIAGNOSIS — Z114 Encounter for screening for human immunodeficiency virus [HIV]: Secondary | ICD-10-CM

## 2016-02-05 DIAGNOSIS — L309 Dermatitis, unspecified: Secondary | ICD-10-CM

## 2016-02-05 DIAGNOSIS — Z23 Encounter for immunization: Secondary | ICD-10-CM | POA: Diagnosis not present

## 2016-02-05 LAB — TSH: TSH: 2.43 m[IU]/L

## 2016-02-05 MED ORDER — TRIAMCINOLONE ACETONIDE 0.1 % EX CREA: 1.0000 "application " | TOPICAL_CREAM | Freq: Two times a day (BID) | CUTANEOUS | 1 refills | Status: AC

## 2016-02-05 NOTE — Patient Instructions (Signed)
Please call to follow up for PAP. I will e-prescribe cream for your rash. Call back soon if you have any concern.

## 2016-02-05 NOTE — Assessment & Plan Note (Signed)
Stable. TSH checked.  I will contact her soon with result. Continue current dose of synthroid in the mean time.

## 2016-02-05 NOTE — Assessment & Plan Note (Signed)
Etiology uncertain. Likely contact dermatitis. Biopsy completed and specimen was sent for pathology. I will call her with result. In the mean time start topical steroid cream which I e-scribed. F/U as needed.

## 2016-02-05 NOTE — Progress Notes (Signed)
Subjective:     Patient ID: Diane NorfolkKeri L Supak, female   DOB: 23-Sep-1970, 45 y.o.   MRN: [id number]  HPI Hypothyroidism: Here for follow up. She is currently compliant with synthroid 150 mcg qd. Denies GI,GU symptoms or fatigue. Rash: C/O recurrent rash on her right skin. On going for years but came back few weeks ago. Itchy, no pain. HM: Here for follow up on routine health care.  Current Outpatient Prescriptions on File Prior to Visit  Medication Sig Dispense Refill  . levothyroxine (SYNTHROID, LEVOTHROID) 150 MCG tablet TAKE 1 TABLET EVERY DAY BEFORE BREAKFAST 30 tablet 0   No current facility-administered medications on file prior to visit.    Past Medical History:  Diagnosis Date  . Depression   . DVT (deep venous thrombosis) (HCC)   . Former smoker 04/06/2012  . Pulmonary embolism (HCC)   . Thyroid disease      Review of Systems  Respiratory: Negative.   Cardiovascular: Negative.   Gastrointestinal: Negative.   Genitourinary: Negative.   All other systems reviewed and are negative.  Vitals:   02/05/16 0848  BP: 128/84  Pulse: 73  Temp: 97.7 F (36.5 C)  TempSrc: Oral  Weight: 223 lb (101.2 kg)  Height: 5\' 8"  (1.727 m)       Objective:   Physical Exam  Constitutional: She appears well-developed. No distress.  Neck: No thyromegaly present.  Cardiovascular: Normal rate, regular rhythm and normal heart sounds.   No murmur heard. Pulmonary/Chest: Effort normal and breath sounds normal. No respiratory distress. She has no wheezes.  Abdominal: Soft. Bowel sounds are normal. She exhibits no distension and no mass. There is no tenderness.  Musculoskeletal: Normal range of motion.  Nursing note and vitals reviewed.      Assessment:     Hypothyroidism Dermatitis Health maintenance    Plan:     Check problem list  Note for her HM: Tdap and flu shot given today. She opted to reschedule her PAP. HIV test done today.  Biopsy of her rash recommended. Risk of  procedure discussed. Informed and written consent obtained.  Biopsy completed with size 3mm punch biopsy equipment under a sterile condition. Very minimal bleeding after procedure otherwise no complication..Marland Kitchen

## 2016-02-06 ENCOUNTER — Telehealth: Payer: Self-pay | Admitting: Family Medicine

## 2016-02-06 LAB — HIV ANTIBODY (ROUTINE TESTING W REFLEX): HIV 1&2 Ab, 4th Generation: NONREACTIVE

## 2016-02-06 MED ORDER — LEVOTHYROXINE SODIUM 150 MCG PO TABS: ORAL_TABLET | ORAL | 1 refills | Status: DC

## 2016-02-06 NOTE — Telephone Encounter (Signed)
Message left to call back.    Note: When she calls back advise her that her HIV test is neg and TSH is normal.  Continue current dose of Synthroid.  I have refilled her meds.

## 2016-02-09 ENCOUNTER — Telehealth: Payer: Self-pay | Admitting: Family Medicine

## 2016-02-09 MED ORDER — FLUOCINONIDE 0.05 % EX CREA: 1.0000 "application " | TOPICAL_CREAM | Freq: Two times a day (BID) | CUTANEOUS | 2 refills | Status: AC

## 2016-02-09 NOTE — Telephone Encounter (Signed)
I called to discuss her derm path result with her. 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 She stated she does have swelling in her legs with prolonged standing.  She has been using Triamcinolone with no improvement. She stated she used a topical steroid cream that starts with the letter "F" in the past that helped.  I E-scribed Fluocinonide. I also recommended frequent leg elevation and wearing of compression stocking. If no improvement with conservative measures will refer to Derm specialist for phototherapy.  She agreed with plan.

## 2016-03-29 ENCOUNTER — Other Ambulatory Visit: Payer: Self-pay | Admitting: *Deleted

## 2016-03-29 MED ORDER — LEVOTHYROXINE SODIUM 150 MCG PO TABS: ORAL_TABLET | ORAL | 1 refills | Status: DC

## 2016-06-20 ENCOUNTER — Telehealth: Payer: Self-pay

## 2016-06-20 ENCOUNTER — Other Ambulatory Visit: Payer: Self-pay | Admitting: Family Medicine

## 2016-06-20 MED ORDER — LEVOTHYROXINE SODIUM 150 MCG PO TABS: ORAL_TABLET | ORAL | 1 refills | Status: DC

## 2016-06-20 NOTE — Telephone Encounter (Signed)
Advised patient that her refill ha been completed

## 2016-06-20 NOTE — Telephone Encounter (Signed)
Pt calling to have Levothyroxine refilled. Please let pt know when this has been done. Sunday SpillersSharon T Haisley Arens, CMA

## 2016-12-14 ENCOUNTER — Other Ambulatory Visit: Payer: Self-pay | Admitting: Family Medicine

## 2017-06-26 ENCOUNTER — Other Ambulatory Visit: Payer: Self-pay | Admitting: *Deleted

## 2017-06-26 MED ORDER — LEVOTHYROXINE SODIUM 150 MCG PO TABS: ORAL_TABLET | ORAL | 0 refills | Status: DC

## 2017-06-26 NOTE — Telephone Encounter (Signed)
Patient need to be seen for next refill

## 2017-06-26 NOTE — Telephone Encounter (Signed)
LM for patient on identified VM asking her to contact our office to schedule an appointment to follow up on her thyroid.  Dusan Lipford,CMA

## 2017-06-27 ENCOUNTER — Other Ambulatory Visit: Payer: Self-pay

## 2017-06-27 ENCOUNTER — Other Ambulatory Visit: Payer: Self-pay | Admitting: Family Medicine

## 2017-06-27 NOTE — Telephone Encounter (Signed)
LM for patient to call back.  She needs to schedule a visit to follow up on her thyroid.  Letter mailed to patient as this is the schedule attempt by phone. Jazmin Hartsell,CMA

## 2017-06-27 NOTE — Telephone Encounter (Signed)
Need to come in for a 90 days supply. Will give only 30 days supply pending her follow-up.

## 2017-07-06 ENCOUNTER — Other Ambulatory Visit: Payer: Self-pay | Admitting: *Deleted

## 2017-07-06 NOTE — Telephone Encounter (Signed)
Patient was due for a follow up appt before any refills would be sent but there wasn't a refill for 06/27/17.  I tried to contact patient many times but was unable to reach.  I did mail a letter asking her to contact us for an appointment but haven't heard back from her yet.  Cotton Beckley,CMA

## 2017-07-14 MED ORDER — LEVOTHYROXINE SODIUM 150 MCG PO TABS: ORAL_TABLET | ORAL | 0 refills | Status: AC

## 2018-09-19 ENCOUNTER — Telehealth: Payer: Self-pay

## 2018-09-19 NOTE — Telephone Encounter (Signed)
LVM for patient to call and schedule an appointment for PAP and Lab work.  Glennie Hawk, CMA

## 2019-04-01 ENCOUNTER — Telehealth: Payer: Self-pay | Admitting: Family Medicine

## 2019-04-01 NOTE — Telephone Encounter (Signed)
Patient had not been seen in more than 3 years. Please contact to confirm if she still want to be a patient of ours. Technically, she should be taken off our patient panelist after 3 years.  Otherwise, help her schedule an appointment with me soon. Thanks.

## 2019-04-01 NOTE — Telephone Encounter (Signed)
Will allow admin team to followup on this.  Diane [name], CMA  

## 2019-07-22 DIAGNOSIS — Z1151 Encounter for screening for human papillomavirus (HPV): Secondary | ICD-10-CM | POA: Diagnosis not present

## 2019-07-22 DIAGNOSIS — Z124 Encounter for screening for malignant neoplasm of cervix: Secondary | ICD-10-CM | POA: Diagnosis not present

## 2019-07-22 DIAGNOSIS — Z130 Encounter for screening for diseases of the blood and blood-forming organs and certain disorders involving the immune mechanism: Secondary | ICD-10-CM | POA: Diagnosis not present

## 2019-07-22 DIAGNOSIS — Z6831 Body mass index (BMI) 31.0-31.9, adult: Secondary | ICD-10-CM | POA: Diagnosis not present

## 2019-07-22 DIAGNOSIS — Z01419 Encounter for gynecological examination (general) (routine) without abnormal findings: Secondary | ICD-10-CM | POA: Diagnosis not present

## 2019-07-23 DIAGNOSIS — E039 Hypothyroidism, unspecified: Secondary | ICD-10-CM | POA: Diagnosis not present

## 2019-07-23 DIAGNOSIS — L309 Dermatitis, unspecified: Secondary | ICD-10-CM | POA: Diagnosis not present

## 2019-07-23 DIAGNOSIS — D649 Anemia, unspecified: Secondary | ICD-10-CM | POA: Diagnosis not present

## 2019-08-05 DIAGNOSIS — Z1231 Encounter for screening mammogram for malignant neoplasm of breast: Secondary | ICD-10-CM | POA: Diagnosis not present

## 2019-08-24 DIAGNOSIS — Z20828 Contact with and (suspected) exposure to other viral communicable diseases: Secondary | ICD-10-CM | POA: Diagnosis not present

## 2019-08-24 DIAGNOSIS — Z03818 Encounter for observation for suspected exposure to other biological agents ruled out: Secondary | ICD-10-CM | POA: Diagnosis not present

## 2019-10-28 DIAGNOSIS — M5417 Radiculopathy, lumbosacral region: Secondary | ICD-10-CM | POA: Diagnosis not present

## 2019-10-28 DIAGNOSIS — M545 Low back pain: Secondary | ICD-10-CM | POA: Diagnosis not present

## 2019-11-04 ENCOUNTER — Telehealth: Payer: Self-pay | Admitting: Family Medicine

## 2019-11-04 NOTE — Telephone Encounter (Signed)
Dear ,  Our record indicates that our provider has not seen you in over three years. Technically, patients not seen in more than three years are taken off our patient panelist and will need to reestablish care as a new patient.   To avoid being taken off our panelist, please schedule a follow-up appointment in the next few weeks.  If you have established health care with a different practice, please let us know so we can go ahead and update your status on our list.  Thank you for entrusting your health care in our hands.

## 2019-11-04 NOTE — Telephone Encounter (Addendum)
An attempt was made again to reach the patient. I was unable to leave a message for her due to a full mailbox.  She had not been seen in our practice for over three years. Hence, technically, she should be taken off the patient panelist. I have sent a certified letter for her to contact us in the next few weeks to schedule. Otherwise, she will need to re-enroll as a new patient.

## 2019-11-07 DIAGNOSIS — M545 Low back pain: Secondary | ICD-10-CM | POA: Diagnosis not present

## 2019-11-14 DIAGNOSIS — M5136 Other intervertebral disc degeneration, lumbar region: Secondary | ICD-10-CM | POA: Diagnosis not present

## 2019-11-14 DIAGNOSIS — M545 Low back pain: Secondary | ICD-10-CM | POA: Diagnosis not present

## 2020-07-28 DIAGNOSIS — M545 Low back pain, unspecified: Secondary | ICD-10-CM | POA: Diagnosis not present

## 2020-08-05 DIAGNOSIS — M9903 Segmental and somatic dysfunction of lumbar region: Secondary | ICD-10-CM | POA: Diagnosis not present

## 2020-08-05 DIAGNOSIS — M9904 Segmental and somatic dysfunction of sacral region: Secondary | ICD-10-CM | POA: Diagnosis not present

## 2020-08-05 DIAGNOSIS — M9901 Segmental and somatic dysfunction of cervical region: Secondary | ICD-10-CM | POA: Diagnosis not present

## 2020-08-05 DIAGNOSIS — M9902 Segmental and somatic dysfunction of thoracic region: Secondary | ICD-10-CM | POA: Diagnosis not present

## 2020-08-14 ENCOUNTER — Emergency Department (HOSPITAL_BASED_OUTPATIENT_CLINIC_OR_DEPARTMENT_OTHER)
Admission: EM | Admit: 2020-08-14 | Discharge: 2020-08-14 | Disposition: A | Discharge status: Home / Self Care | Payer: BC Managed Care – PPO | Attending: Emergency Medicine | Admitting: Emergency Medicine

## 2020-08-14 ENCOUNTER — Encounter (HOSPITAL_BASED_OUTPATIENT_CLINIC_OR_DEPARTMENT_OTHER): Payer: Self-pay | Admitting: Emergency Medicine

## 2020-08-14 ENCOUNTER — Emergency Department (HOSPITAL_BASED_OUTPATIENT_CLINIC_OR_DEPARTMENT_OTHER): Payer: BC Managed Care – PPO

## 2020-08-14 ENCOUNTER — Other Ambulatory Visit: Payer: Self-pay

## 2020-08-14 DIAGNOSIS — M7989 Other specified soft tissue disorders: Secondary | ICD-10-CM | POA: Insufficient documentation

## 2020-08-14 DIAGNOSIS — Z7982 Long term (current) use of aspirin: Secondary | ICD-10-CM | POA: Insufficient documentation

## 2020-08-14 DIAGNOSIS — E039 Hypothyroidism, unspecified: Secondary | ICD-10-CM | POA: Diagnosis not present

## 2020-08-14 DIAGNOSIS — Z87891 Personal history of nicotine dependence: Secondary | ICD-10-CM | POA: Insufficient documentation

## 2020-08-14 DIAGNOSIS — M25572 Pain in left ankle and joints of left foot: Secondary | ICD-10-CM | POA: Diagnosis not present

## 2020-08-14 DIAGNOSIS — Z79899 Other long term (current) drug therapy: Secondary | ICD-10-CM | POA: Insufficient documentation

## 2020-08-14 NOTE — ED Triage Notes (Signed)
Pt via pov from home with left ankle pain that began Tuesday evening. Pt reports that she walked in the morning, and went about her normal activities and had a sharp pain in her left ankle that night. She reports that the pain has gotten worse since then. Pt has visible swelling to left ankle. Pt reports that she has continued to walk and pursue normal activities. Pt alert & oriented, nad noted.

## 2020-08-14 NOTE — ED Provider Notes (Signed)
[facility] EMERGENCY DEPT Provider Note   CSN: [id number] Arrival date & time: 08/14/20  1751     History Chief Complaint  Patient presents with  . Ankle Pain    Diane [name] is a 50 y.o. female presenting to ED with ankle pain.  It started 3 days ago when she began having spontaneous pain in the evening.  She had gone on a morning walk earlier but no falls, trauma, or pain at the time.  Pain has been steadily worsening since Tuesday night, with swelling.  Pain worse with weight bearing, better at rest.  She is able to continue walking.  Her pain and swelling are localized to lateral and upper aspect of the left ankle.  Hx of DVT in right lower leg in 2015 and PE, on xarelto at the time but no longer taking it after 6 months treatment.  DVT was spontaneous - no known inciting events.  She was continued on aspirin 81 mg daily for several years but stopped 1 year ago.  No numbness or weakness in her left foot.  She reports her doctor started her on prednisone last week.  HPI     Past Medical History:  Diagnosis Date  . Depression   . DVT (deep venous thrombosis) (HCC)   . Former smoker 04/06/2012  . Pulmonary embolism (HCC)   . Thyroid disease     Patient Active Problem List   Diagnosis Date Noted  . Dermatitis 02/05/2016  . Thromboembolism (HCC) 01/07/2014  . DVT (deep venous thrombosis) (HCC)   . Pulmonary embolism (HCC)   . Hypothyroidism 07/20/2006  . OBESITY, NOS 07/20/2006  . DEPRESSIVE DISORDER, NOS 07/20/2006    History reviewed. No pertinent surgical history.   OB History   No obstetric history on file.     Family History  Problem Relation Age of Onset  . Hypertension Mother   . Heart disease Father 18    Social History   Tobacco Use  . Smoking status: Former Smoker    Quit date: 02/21/2012    Years since quitting: 8.4  . Smokeless tobacco: Former Neurosurgeon    Quit date: 12/23/2010  . Tobacco comment: has quitt in 10-11 and  re-started smoking again  Vaping Use  . Vaping Use: Never used  Substance Use Topics  . Alcohol use: No  . Drug use: No    Home Medications Prior to Admission medications   Medication Sig Start Date End Date Taking? Authorizing Provider  levothyroxine (SYNTHROID, LEVOTHROID) 150 MCG tablet TAKE 1 TABLET EVERY DAY BEFORE BREAKFAST 07/14/17  Yes Nestor Ramp, MD  aspirin EC 81 MG tablet Take 81 mg by mouth daily.    [provider]  fluocinonide cream (LIDEX) 0.05 % Apply 1 application topically 2 (two) times daily. Discontinue Triamcinolone. 02/09/16   Doreene Eland, MD  triamcinolone cream (KENALOG) 0.1 % Apply 1 application topically 2 (two) times daily. 02/05/16   Doreene Eland, MD  vitamin B-12 (CYANOCOBALAMIN) 1000 MCG tablet Take 1,000 mcg by mouth daily.    [provider]    Allergies    Patient has no known allergies.  Review of Systems   Review of Systems  Constitutional: Negative for chills and fever.  Eyes: Negative for pain and visual disturbance.  Respiratory: Negative for cough and shortness of breath.   Cardiovascular: Negative for chest pain and palpitations.  Gastrointestinal: Negative for abdominal pain and vomiting.  Musculoskeletal: Positive for arthralgias and myalgias.  Skin: Positive  for rash. Negative for wound.  Neurological: Negative for weakness and numbness.  Psychiatric/Behavioral: Negative for agitation and confusion.  All other systems reviewed and are negative.   Physical Exam Updated Vital Signs BP (!) 150/86   Pulse 81   Temp 99.8 F (37.7 C) (Oral)   Resp 18   Ht 5\' 9"  (1.753 m)   Wt 99.8 kg   LMP 07/31/2020 (Approximate)   SpO2 96%   BMI 32.49 kg/m   Physical Exam Constitutional:      General: She is not in acute distress. HENT:     Head: Normocephalic and atraumatic.  Eyes:     Conjunctiva/sclera: Conjunctivae normal.     Pupils: Pupils are equal, round, and reactive to light.  Cardiovascular:      Rate and Rhythm: Normal rate and regular rhythm.     Pulses: Normal pulses.  Pulmonary:     Effort: Pulmonary effort is normal. No respiratory distress.  Abdominal:     General: There is no distension.     Tenderness: There is no abdominal tenderness.  Musculoskeletal:     Comments: Swelling without significant tenderness to palpation of the left ankle No warmth or overlying erythema Very minimal pain with active or passive ROM of the left ankle No posterior calf, popliteal, or thigh tenderness No isolated posterior malleolar tenderness of the left ankle Patient able to bear weight  Skin:    General: Skin is warm and dry.  Neurological:     General: No focal deficit present.     Mental Status: She is alert and oriented to person, place, and time. Mental status is at baseline.     Sensory: No sensory deficit.     Motor: No weakness.  Psychiatric:        Mood and Affect: Mood normal.        Behavior: Behavior normal.     ED Results / Procedures / Treatments   Labs (all labs ordered are listed, but only abnormal results are displayed) Labs Reviewed - No data to display  EKG None  Radiology 09/30/2020 Venous Img Lower Unilateral Left  Result Date: 08/14/2020 CLINICAL DATA:  Left lower extremity swelling for several days EXAM: LEFT LOWER EXTREMITY VENOUS DOPPLER ULTRASOUND TECHNIQUE: Gray-scale sonography with graded compression, as well as color Doppler and duplex ultrasound were performed to evaluate the lower extremity deep venous systems from the level of the common femoral vein and including the common femoral, femoral, profunda femoral, popliteal and calf veins including the posterior tibial, peroneal and gastrocnemius veins when visible. The superficial great saphenous vein was also interrogated. Spectral Doppler was utilized to evaluate flow at rest and with distal augmentation maneuvers in the common femoral, femoral and popliteal veins. COMPARISON:  None. FINDINGS: Contralateral  Common Femoral Vein: Respiratory phasicity is normal and symmetric with the symptomatic side. No evidence of thrombus. Normal compressibility. Common Femoral Vein: No evidence of thrombus. Normal compressibility, respiratory phasicity and response to augmentation. Saphenofemoral Junction: No evidence of thrombus. Normal compressibility and flow on color Doppler imaging. Profunda Femoral Vein: No evidence of thrombus. Normal compressibility and flow on color Doppler imaging. Femoral Vein: No evidence of thrombus. Normal compressibility, respiratory phasicity and response to augmentation. Popliteal Vein: No evidence of thrombus. Normal compressibility, respiratory phasicity and response to augmentation. Calf Veins: No evidence of thrombus. Normal compressibility and flow on color Doppler imaging. Superficial Great Saphenous Vein: No evidence of thrombus. Normal compressibility. Venous Reflux:  None. Other Findings: Focal fluid collection is noted  in the anterior aspect of the ankle likely related to a joint effusion. This corresponds to the patient's area of clinical symptomatology. IMPRESSION: No evidence of deep venous thrombosis. Fluid collection near the ankle joint likely representing a joint effusion. Electronically Signed   By: Alcide Clever M.D.   On: 08/14/2020 19:29    Procedures Procedures   Medications Ordered in ED Medications - No data to display  ED Course  I have reviewed the triage vital signs and the nursing notes.  Pertinent labs & imaging results that were available during my care of the patient were reviewed by me and considered in my medical decision making (see chart for details).  DDx includes tendon/ankle sprain most likely vs. DVT vs other  Doubt joint infection clinically Doubt fracture - negative for [place] ankle criteria.  Patient able to easily bear weight  DVT ultrasound ordered   *  No acute DVT noted on ultrasound.  Small joint effusion.  May be related to  surrounding tendon injury vs pseudogout vs other mild inflammatory condition - doubt sepsis or infection.  RICE recommendations, if no improvement in 1 week, she can schedule a follow up appointment with her own orthopedist.   Final Clinical Impression(s) / ED Diagnoses Final diagnoses:  Acute left ankle pain    Rx / DC Orders ED Discharge Orders    None       Aimi Essner, Kermit Balo, MD 08/14/20 1940

## 2020-08-14 NOTE — Discharge Instructions (Addendum)
You do not need an ankle brace at this time.  You can bear weight on your foot as tolerated.  I recommend resting it as much as possible for the next 7 days - put your foot up on the couch (elevation) whenever possible, and apply ice or cooling packs for ten minutes at a time to the area of swelling, as needed.  You can take motrin (ibuprofen) 600 mg every 6 hours for the next 5 days for pain and swelling.  If the ankle is still hurting you in 1 week, make an appointment with your orthopedic doctor.

## 2020-08-31 DIAGNOSIS — M25472 Effusion, left ankle: Secondary | ICD-10-CM | POA: Diagnosis not present

## 2020-11-12 DIAGNOSIS — N926 Irregular menstruation, unspecified: Secondary | ICD-10-CM | POA: Diagnosis not present

## 2020-11-19 DIAGNOSIS — Z124 Encounter for screening for malignant neoplasm of cervix: Secondary | ICD-10-CM | POA: Diagnosis not present

## 2020-11-19 DIAGNOSIS — Z01419 Encounter for gynecological examination (general) (routine) without abnormal findings: Secondary | ICD-10-CM | POA: Diagnosis not present

## 2020-11-19 DIAGNOSIS — Z1151 Encounter for screening for human papillomavirus (HPV): Secondary | ICD-10-CM | POA: Diagnosis not present

## 2020-11-19 DIAGNOSIS — Z130 Encounter for screening for diseases of the blood and blood-forming organs and certain disorders involving the immune mechanism: Secondary | ICD-10-CM | POA: Diagnosis not present

## 2020-12-01 DIAGNOSIS — N938 Other specified abnormal uterine and vaginal bleeding: Secondary | ICD-10-CM | POA: Diagnosis not present

## 2020-12-01 DIAGNOSIS — Z1231 Encounter for screening mammogram for malignant neoplasm of breast: Secondary | ICD-10-CM | POA: Diagnosis not present

## 2021-01-08 DIAGNOSIS — R030 Elevated blood-pressure reading, without diagnosis of hypertension: Secondary | ICD-10-CM | POA: Diagnosis not present

## 2021-01-08 DIAGNOSIS — Z131 Encounter for screening for diabetes mellitus: Secondary | ICD-10-CM | POA: Diagnosis not present

## 2021-01-08 DIAGNOSIS — D649 Anemia, unspecified: Secondary | ICD-10-CM | POA: Diagnosis not present

## 2021-01-08 DIAGNOSIS — Z13220 Encounter for screening for lipoid disorders: Secondary | ICD-10-CM | POA: Diagnosis not present

## 2021-01-08 DIAGNOSIS — E039 Hypothyroidism, unspecified: Secondary | ICD-10-CM | POA: Diagnosis not present

## 2021-01-08 DIAGNOSIS — J45909 Unspecified asthma, uncomplicated: Secondary | ICD-10-CM | POA: Diagnosis not present

## 2021-01-08 DIAGNOSIS — Z0000 Encounter for general adult medical examination without abnormal findings: Secondary | ICD-10-CM | POA: Diagnosis not present

## 2021-02-02 DIAGNOSIS — Z20822 Contact with and (suspected) exposure to covid-19: Secondary | ICD-10-CM | POA: Diagnosis not present

## 2021-02-07 DIAGNOSIS — J0110 Acute frontal sinusitis, unspecified: Secondary | ICD-10-CM | POA: Diagnosis not present

## 2021-04-04 IMAGING — US US EXTREM LOW VENOUS*L*
1 series · 13 of 24 positions shown · non-contrast
Comparison: None.

CLINICAL DATA: Left lower extremity swelling for several days



[Series 1: us venous img lower uni left (dvt) · portal-venous · 54 acquisitions, 13 frames shown]
[im 1/54]
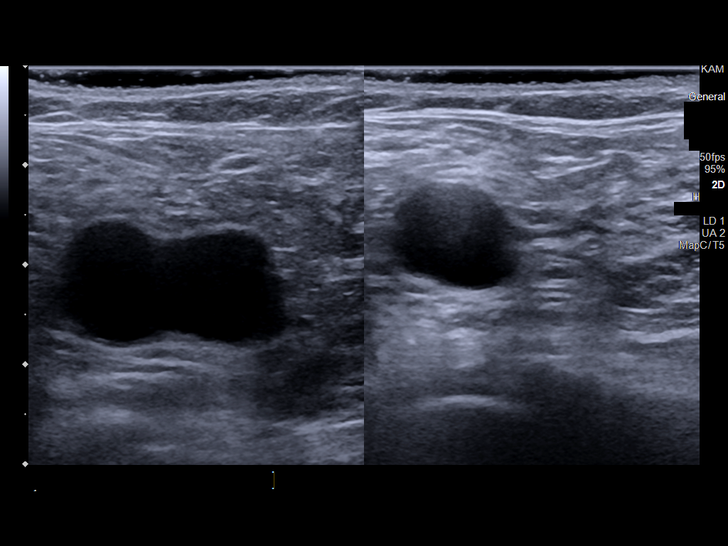
[im 5/54]
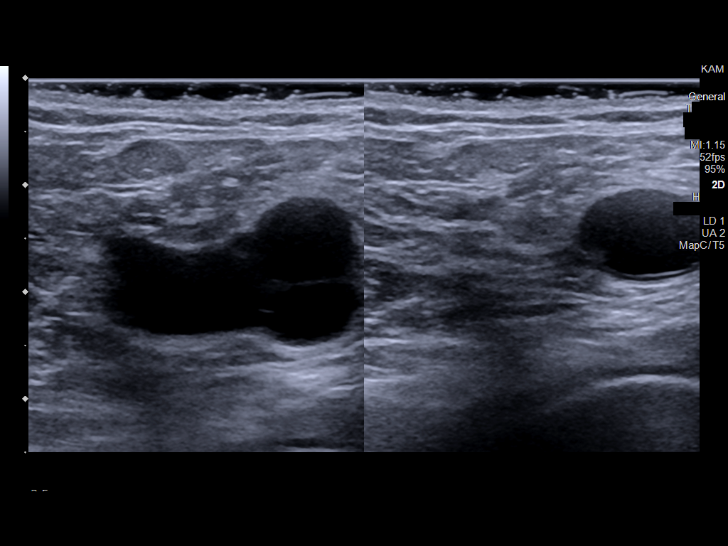
[im 10/54]
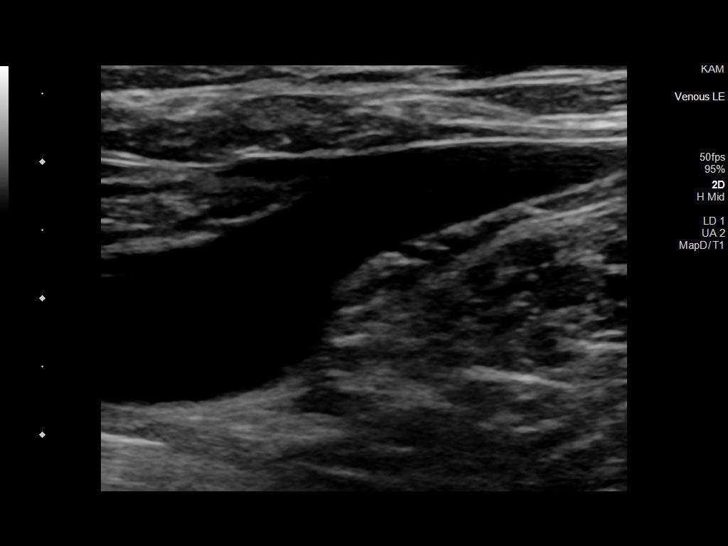
[im 14/54]
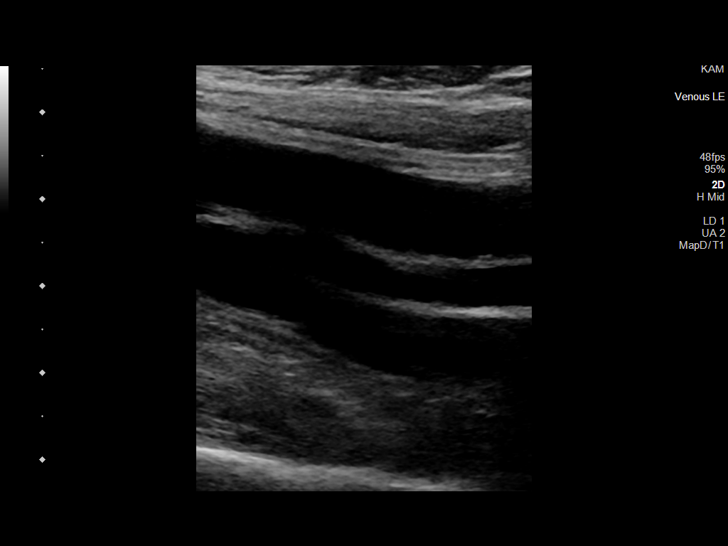
[im 19/54]
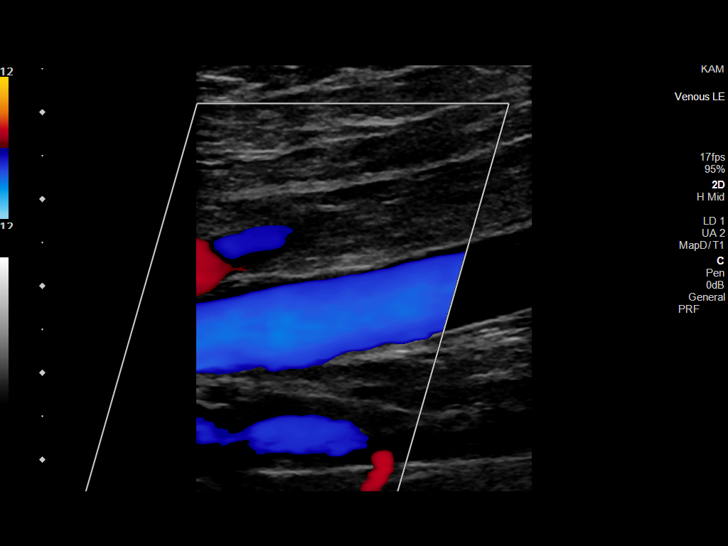
[im 24/54]
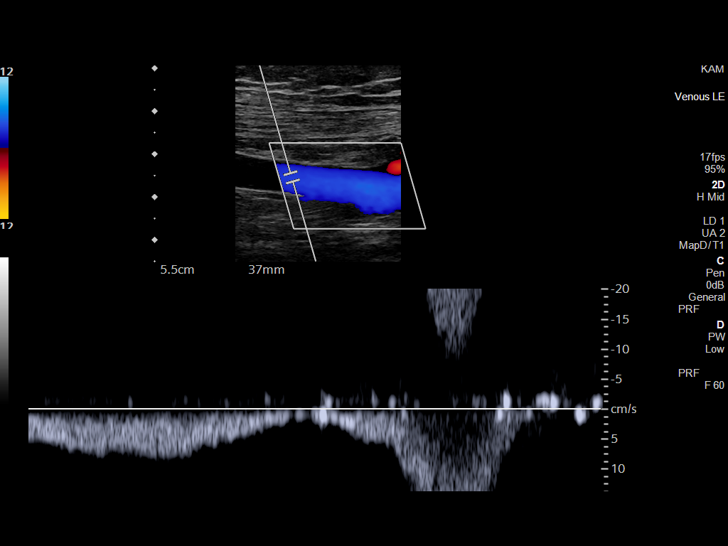
[im 30/54]
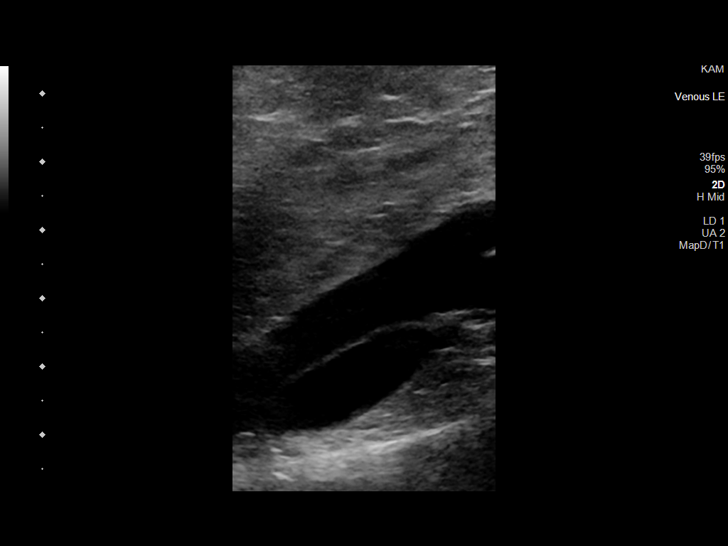
[im 33/54]
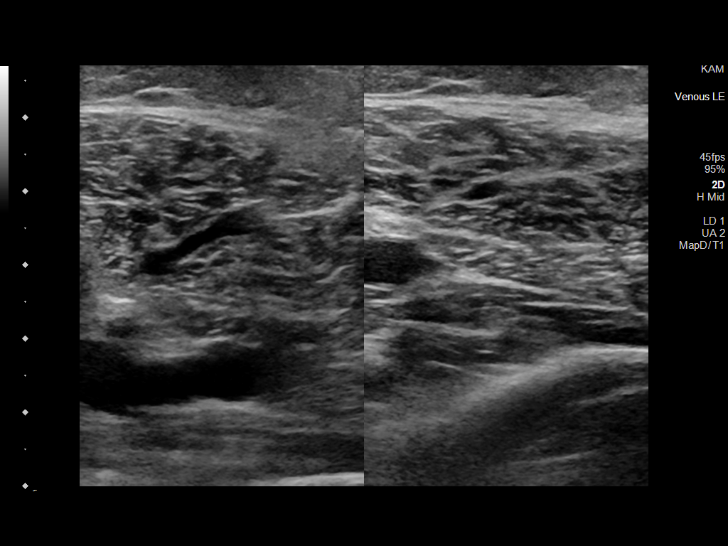
[im 37/54]
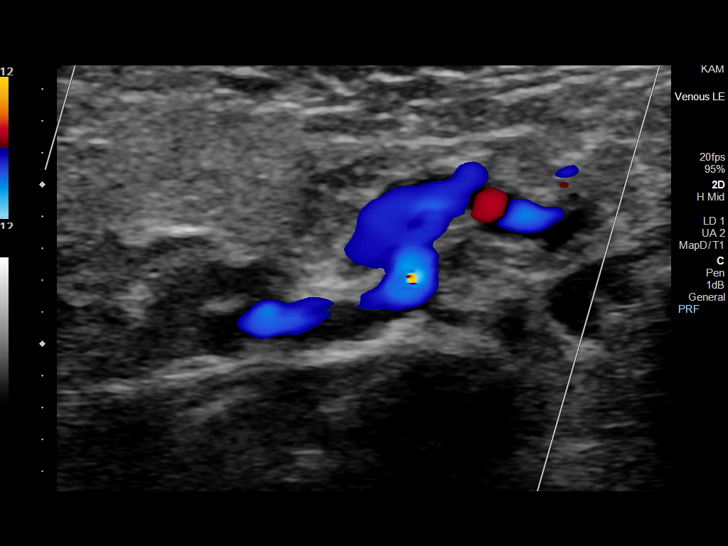
[im 42/54]
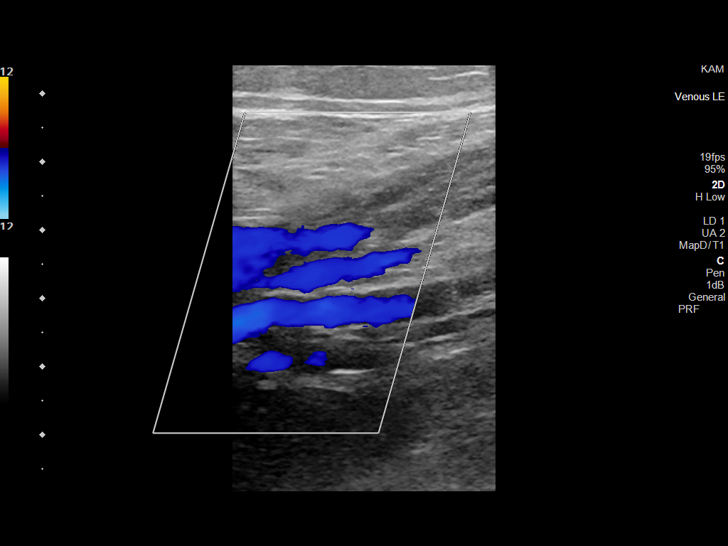
[im 47/54]
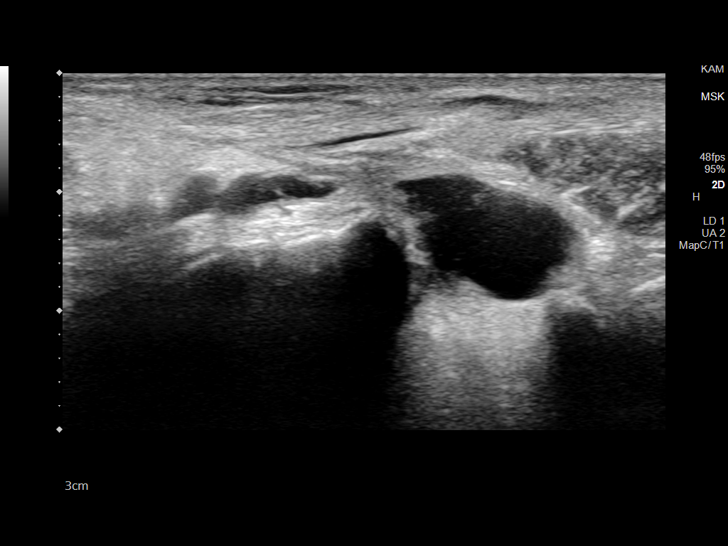
[im 51/54]
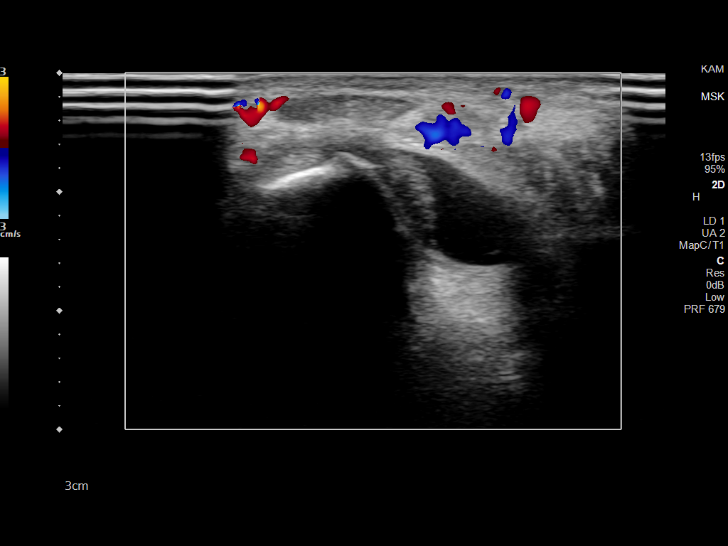
[im 54/54]
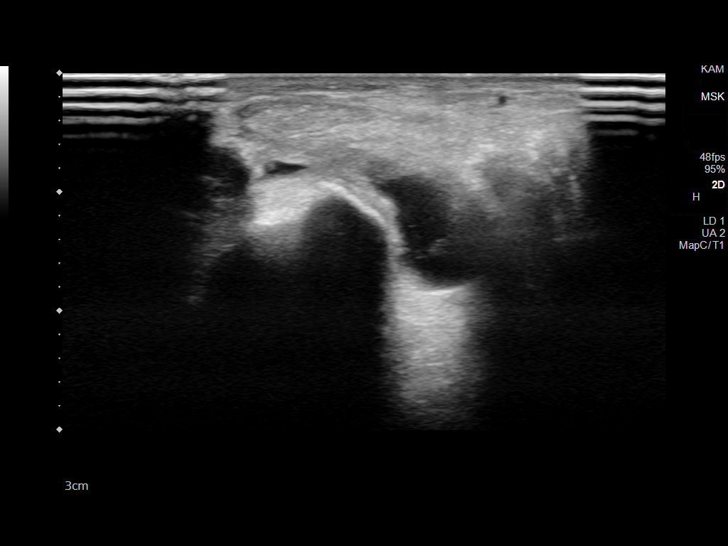

[13 of 24 positions shown; findings below may reference images not displayed]

FINDINGS: Contralateral Common Femoral Vein: Respiratory phasicity is normal
and symmetric with the symptomatic side. No evidence of thrombus.
Normal compressibility.

Common Femoral Vein: No evidence of thrombus. Normal
compressibility, respiratory phasicity and response to augmentation.

Saphenofemoral Junction: No evidence of thrombus. Normal
compressibility and flow on color Doppler imaging.

Profunda Femoral Vein: No evidence of thrombus. Normal
compressibility and flow on color Doppler imaging.

Femoral Vein: No evidence of thrombus. Normal compressibility,
respiratory phasicity and response to augmentation.

Popliteal Vein: No evidence of thrombus. Normal compressibility,
respiratory phasicity and response to augmentation.

Calf Veins: No evidence of thrombus. Normal compressibility and flow
on color Doppler imaging.

Superficial Great Saphenous Vein: No evidence of thrombus. Normal
compressibility.

Venous Reflux:  None.

Other Findings: Focal fluid collection is noted in the anterior
aspect of the ankle likely related to a joint effusion. This
corresponds to the patient's area of clinical symptomatology.
IMPRESSION: No evidence of deep venous thrombosis.

Fluid collection near the ankle joint likely representing a joint
effusion.

## 2021-04-21 DIAGNOSIS — L918 Other hypertrophic disorders of the skin: Secondary | ICD-10-CM | POA: Diagnosis not present

## 2021-06-01 DIAGNOSIS — N939 Abnormal uterine and vaginal bleeding, unspecified: Secondary | ICD-10-CM | POA: Diagnosis not present

## 2021-10-06 DIAGNOSIS — Z03818 Encounter for observation for suspected exposure to other biological agents ruled out: Secondary | ICD-10-CM | POA: Diagnosis not present

## 2021-10-06 DIAGNOSIS — B09 Unspecified viral infection characterized by skin and mucous membrane lesions: Secondary | ICD-10-CM | POA: Diagnosis not present

## 2021-10-06 DIAGNOSIS — R030 Elevated blood-pressure reading, without diagnosis of hypertension: Secondary | ICD-10-CM | POA: Diagnosis not present

## 2021-10-06 DIAGNOSIS — R509 Fever, unspecified: Secondary | ICD-10-CM | POA: Diagnosis not present

## 2021-10-06 DIAGNOSIS — R21 Rash and other nonspecific skin eruption: Secondary | ICD-10-CM | POA: Diagnosis not present

## 2021-12-20 DIAGNOSIS — L820 Inflamed seborrheic keratosis: Secondary | ICD-10-CM | POA: Diagnosis not present

## 2021-12-31 DIAGNOSIS — L820 Inflamed seborrheic keratosis: Secondary | ICD-10-CM | POA: Diagnosis not present

## 2022-04-01 DIAGNOSIS — M7061 Trochanteric bursitis, right hip: Secondary | ICD-10-CM | POA: Diagnosis not present

## 2022-04-01 DIAGNOSIS — M545 Low back pain, unspecified: Secondary | ICD-10-CM | POA: Diagnosis not present

## 2022-04-12 DIAGNOSIS — E669 Obesity, unspecified: Secondary | ICD-10-CM | POA: Diagnosis not present

## 2022-04-12 DIAGNOSIS — F330 Major depressive disorder, recurrent, mild: Secondary | ICD-10-CM | POA: Diagnosis not present

## 2022-04-12 DIAGNOSIS — E039 Hypothyroidism, unspecified: Secondary | ICD-10-CM | POA: Diagnosis not present

## 2022-04-12 DIAGNOSIS — E7800 Pure hypercholesterolemia, unspecified: Secondary | ICD-10-CM | POA: Diagnosis not present

## 2022-04-12 DIAGNOSIS — Z0000 Encounter for general adult medical examination without abnormal findings: Secondary | ICD-10-CM | POA: Diagnosis not present

## 2022-05-02 DIAGNOSIS — Z1211 Encounter for screening for malignant neoplasm of colon: Secondary | ICD-10-CM | POA: Diagnosis not present

## 2022-11-15 ENCOUNTER — Other Ambulatory Visit (HOSPITAL_COMMUNITY): Payer: Self-pay | Admitting: Gastroenterology

## 2022-11-15 ENCOUNTER — Other Ambulatory Visit: Payer: Self-pay | Admitting: Gastroenterology

## 2022-11-15 DIAGNOSIS — R1013 Epigastric pain: Secondary | ICD-10-CM | POA: Diagnosis not present

## 2022-11-15 DIAGNOSIS — R1011 Right upper quadrant pain: Secondary | ICD-10-CM

## 2022-11-15 DIAGNOSIS — F32A Depression, unspecified: Secondary | ICD-10-CM | POA: Diagnosis not present

## 2022-11-15 DIAGNOSIS — K8080 Other cholelithiasis without obstruction: Secondary | ICD-10-CM | POA: Diagnosis not present

## 2022-11-21 DIAGNOSIS — R5383 Other fatigue: Secondary | ICD-10-CM | POA: Diagnosis not present

## 2022-11-21 DIAGNOSIS — R030 Elevated blood-pressure reading, without diagnosis of hypertension: Secondary | ICD-10-CM | POA: Diagnosis not present

## 2022-12-01 ENCOUNTER — Encounter (HOSPITAL_COMMUNITY)
Admission: RE | Admit: 2022-12-01 | Discharge: 2022-12-01 | Disposition: A | Discharge status: Home / Self Care | Payer: BC Managed Care – PPO | Source: Ambulatory Visit | Attending: Gastroenterology | Admitting: Gastroenterology

## 2022-12-01 ENCOUNTER — Ambulatory Visit (HOSPITAL_COMMUNITY)
Admission: RE | Admit: 2022-12-01 | Discharge: 2022-12-01 | Disposition: A | Discharge status: Home / Self Care | Payer: BC Managed Care – PPO | Source: Ambulatory Visit | Attending: Gastroenterology | Admitting: Gastroenterology

## 2022-12-01 DIAGNOSIS — R1011 Right upper quadrant pain: Secondary | ICD-10-CM | POA: Diagnosis not present

## 2022-12-01 DIAGNOSIS — K760 Fatty (change of) liver, not elsewhere classified: Secondary | ICD-10-CM | POA: Diagnosis not present

## 2022-12-01 DIAGNOSIS — K8020 Calculus of gallbladder without cholecystitis without obstruction: Secondary | ICD-10-CM | POA: Diagnosis not present

## 2022-12-01 MED ORDER — TECHNETIUM TC 99M MEBROFENIN IV KIT
5.3500 | PACK | Freq: Once | INTRAVENOUS | Status: AC | PRN
  Administered 2022-12-01: 5.35 via INTRAVENOUS

## 2023-01-12 DIAGNOSIS — Z130 Encounter for screening for diseases of the blood and blood-forming organs and certain disorders involving the immune mechanism: Secondary | ICD-10-CM | POA: Diagnosis not present

## 2023-01-12 DIAGNOSIS — Z01419 Encounter for gynecological examination (general) (routine) without abnormal findings: Secondary | ICD-10-CM | POA: Diagnosis not present

## 2023-01-12 DIAGNOSIS — Z1231 Encounter for screening mammogram for malignant neoplasm of breast: Secondary | ICD-10-CM | POA: Diagnosis not present

## 2023-03-15 DIAGNOSIS — M5451 Vertebrogenic low back pain: Secondary | ICD-10-CM | POA: Diagnosis not present

## 2023-03-15 DIAGNOSIS — M7061 Trochanteric bursitis, right hip: Secondary | ICD-10-CM | POA: Diagnosis not present

## 2023-03-15 DIAGNOSIS — M7062 Trochanteric bursitis, left hip: Secondary | ICD-10-CM | POA: Diagnosis not present

## 2023-03-21 DIAGNOSIS — H40053 Ocular hypertension, bilateral: Secondary | ICD-10-CM | POA: Diagnosis not present

## 2023-06-28 ENCOUNTER — Other Ambulatory Visit: Payer: Self-pay

## 2023-06-28 DIAGNOSIS — U071 COVID-19: Secondary | ICD-10-CM | POA: Diagnosis not present

## 2023-06-28 DIAGNOSIS — Z5321 Procedure and treatment not carried out due to patient leaving prior to being seen by health care provider: Secondary | ICD-10-CM | POA: Diagnosis not present

## 2023-06-28 DIAGNOSIS — M791 Myalgia, unspecified site: Secondary | ICD-10-CM | POA: Diagnosis present

## 2023-06-29 ENCOUNTER — Other Ambulatory Visit: Payer: Self-pay

## 2023-06-29 ENCOUNTER — Encounter (HOSPITAL_BASED_OUTPATIENT_CLINIC_OR_DEPARTMENT_OTHER): Payer: Self-pay | Admitting: Emergency Medicine

## 2023-06-29 ENCOUNTER — Emergency Department (HOSPITAL_BASED_OUTPATIENT_CLINIC_OR_DEPARTMENT_OTHER)
Admission: EM | Admit: 2023-06-29 | Discharge: 2023-06-29 | Discharge status: Against Medical Advice | Payer: BC Managed Care – PPO | Attending: Emergency Medicine | Admitting: Emergency Medicine

## 2023-06-29 LAB — RESP PANEL BY RT-PCR (RSV, FLU A&B, COVID)  RVPGX2
Influenza A by PCR: NEGATIVE
Influenza B by PCR: NEGATIVE
Resp Syncytial Virus by PCR: NEGATIVE
SARS Coronavirus 2 by RT PCR: POSITIVE — AB

## 2023-06-29 NOTE — ED Notes (Signed)
 Pt informed she would have to go back to the lobby to await room assignment Pt sts she no longer wants to wait - sts she is ready to go home as she had her elderly father with her

## 2023-06-29 NOTE — ED Notes (Signed)
 Pt brought back to triage for reassessment. VS updated. Reports ongoing productive cough - clear. No SOB/Chest pain.

## 2023-06-29 NOTE — ED Triage Notes (Addendum)
 Pt to ED from home c/o body aches, chills, fatigue, and cough since Saturday. States took home covid test that was positive.
